# Patient Record
Sex: Female | Born: 1965 | Race: White | Hispanic: No | Marital: Married | Smoking: Current every day smoker
Health system: Southern US, Community
[De-identification: ages and names within clinical notes are randomized; demographics above are authoritative.]

## PROBLEM LIST (undated history)

## (undated) DIAGNOSIS — E782 Mixed hyperlipidemia: Secondary | ICD-10-CM

## (undated) DIAGNOSIS — F1911 Other psychoactive substance abuse, in remission: Secondary | ICD-10-CM

## (undated) DIAGNOSIS — F988 Other specified behavioral and emotional disorders with onset usually occurring in childhood and adolescence: Secondary | ICD-10-CM

## (undated) DIAGNOSIS — M26609 Unspecified temporomandibular joint disorder, unspecified side: Secondary | ICD-10-CM

## (undated) DIAGNOSIS — N979 Female infertility, unspecified: Secondary | ICD-10-CM

## (undated) HISTORY — DX: Mixed hyperlipidemia: E78.2

## (undated) HISTORY — DX: Other psychoactive substance abuse, in remission: F19.11

## (undated) HISTORY — DX: Female infertility, unspecified: N97.9

## (undated) HISTORY — DX: Unspecified temporomandibular joint disorder, unspecified side: M26.609

## (undated) HISTORY — DX: Other specified behavioral and emotional disorders with onset usually occurring in childhood and adolescence: F98.8

---

## 1997-07-22 ENCOUNTER — Other Ambulatory Visit: Admission: RE | Admit: 1997-07-22 | Discharge: 1997-07-22 | Payer: Self-pay | Admitting: Orthopedic Surgery

## 1998-04-28 ENCOUNTER — Encounter: Payer: Self-pay | Admitting: Obstetrics and Gynecology

## 1998-04-28 ENCOUNTER — Ambulatory Visit (HOSPITAL_COMMUNITY): Admission: RE | Admit: 1998-04-28 | Discharge: 1998-04-28 | Payer: Self-pay | Admitting: Obstetrics and Gynecology

## 1998-05-26 ENCOUNTER — Encounter: Payer: Self-pay | Admitting: Obstetrics and Gynecology

## 1998-05-26 ENCOUNTER — Ambulatory Visit (HOSPITAL_COMMUNITY): Admission: RE | Admit: 1998-05-26 | Discharge: 1998-05-26 | Payer: Self-pay | Admitting: Obstetrics and Gynecology

## 1999-07-15 ENCOUNTER — Ambulatory Visit (HOSPITAL_COMMUNITY): Admission: RE | Admit: 1999-07-15 | Discharge: 1999-07-15 | Payer: Self-pay | Admitting: Obstetrics and Gynecology

## 1999-07-15 ENCOUNTER — Encounter: Payer: Self-pay | Admitting: Obstetrics and Gynecology

## 2000-01-07 ENCOUNTER — Other Ambulatory Visit: Admission: RE | Admit: 2000-01-07 | Discharge: 2000-01-07 | Payer: Self-pay | Admitting: Obstetrics and Gynecology

## 2000-04-18 ENCOUNTER — Encounter: Payer: Self-pay | Admitting: Obstetrics and Gynecology

## 2000-04-18 ENCOUNTER — Ambulatory Visit (HOSPITAL_COMMUNITY): Admission: RE | Admit: 2000-04-18 | Discharge: 2000-04-18 | Payer: Self-pay | Admitting: Obstetrics and Gynecology

## 2000-04-24 ENCOUNTER — Encounter: Payer: Self-pay | Admitting: Specialist

## 2000-04-24 ENCOUNTER — Encounter: Admission: RE | Admit: 2000-04-24 | Discharge: 2000-04-24 | Payer: Self-pay | Admitting: Specialist

## 2001-01-29 ENCOUNTER — Encounter: Payer: Self-pay | Admitting: Specialist

## 2001-01-29 ENCOUNTER — Encounter: Admission: RE | Admit: 2001-01-29 | Discharge: 2001-01-29 | Payer: Self-pay | Admitting: Specialist

## 2001-07-24 ENCOUNTER — Other Ambulatory Visit: Admission: RE | Admit: 2001-07-24 | Discharge: 2001-07-24 | Payer: Self-pay | Admitting: Obstetrics and Gynecology

## 2001-10-09 ENCOUNTER — Encounter: Admission: RE | Admit: 2001-10-09 | Discharge: 2001-10-09 | Payer: Self-pay | Admitting: *Deleted

## 2001-10-09 ENCOUNTER — Encounter: Payer: Self-pay | Admitting: *Deleted

## 2002-06-07 ENCOUNTER — Other Ambulatory Visit: Admission: RE | Admit: 2002-06-07 | Discharge: 2002-06-07 | Payer: Self-pay | Admitting: Obstetrics and Gynecology

## 2002-09-25 ENCOUNTER — Encounter: Admission: RE | Admit: 2002-09-25 | Discharge: 2002-09-25 | Payer: Self-pay | Admitting: Obstetrics and Gynecology

## 2002-09-25 ENCOUNTER — Encounter: Payer: Self-pay | Admitting: Obstetrics and Gynecology

## 2002-11-04 ENCOUNTER — Encounter: Payer: Self-pay | Admitting: *Deleted

## 2002-11-04 ENCOUNTER — Inpatient Hospital Stay (HOSPITAL_COMMUNITY): Admission: AD | Admit: 2002-11-04 | Discharge: 2002-11-04 | Payer: Self-pay | Admitting: *Deleted

## 2002-12-12 ENCOUNTER — Inpatient Hospital Stay (HOSPITAL_COMMUNITY): Admission: AD | Admit: 2002-12-12 | Discharge: 2002-12-12 | Payer: Self-pay | Admitting: *Deleted

## 2002-12-19 ENCOUNTER — Inpatient Hospital Stay (HOSPITAL_COMMUNITY): Admission: AD | Admit: 2002-12-19 | Discharge: 2002-12-22 | Payer: Self-pay | Admitting: Obstetrics and Gynecology

## 2003-01-28 ENCOUNTER — Other Ambulatory Visit: Admission: RE | Admit: 2003-01-28 | Discharge: 2003-01-28 | Payer: Self-pay | Admitting: Obstetrics and Gynecology

## 2004-02-19 ENCOUNTER — Other Ambulatory Visit: Admission: RE | Admit: 2004-02-19 | Discharge: 2004-02-19 | Payer: Self-pay | Admitting: Obstetrics and Gynecology

## 2004-04-02 ENCOUNTER — Ambulatory Visit: Payer: Self-pay | Admitting: Internal Medicine

## 2004-04-02 ENCOUNTER — Ambulatory Visit (HOSPITAL_BASED_OUTPATIENT_CLINIC_OR_DEPARTMENT_OTHER): Admission: RE | Admit: 2004-04-02 | Discharge: 2004-04-02 | Payer: Self-pay | Admitting: Obstetrics and Gynecology

## 2005-06-30 ENCOUNTER — Encounter: Admission: RE | Admit: 2005-06-30 | Discharge: 2005-06-30 | Payer: Self-pay | Admitting: Obstetrics and Gynecology

## 2006-04-01 LAB — POCT INR: INR: 1 (ref <=1.1)

## 2006-04-01 LAB — PROTIME-INR

## 2006-06-17 ENCOUNTER — Ambulatory Visit (HOSPITAL_COMMUNITY): Admission: RE | Admit: 2006-06-17 | Discharge: 2006-06-17 | Payer: Self-pay | Admitting: Obstetrics and Gynecology

## 2006-06-17 ENCOUNTER — Encounter (INDEPENDENT_AMBULATORY_CARE_PROVIDER_SITE_OTHER): Payer: Self-pay | Admitting: Specialist

## 2007-02-25 ENCOUNTER — Emergency Department (HOSPITAL_COMMUNITY): Admission: EM | Admit: 2007-02-25 | Discharge: 2007-02-25 | Payer: Self-pay | Admitting: Emergency Medicine

## 2010-04-04 ENCOUNTER — Encounter: Payer: Self-pay | Admitting: *Deleted

## 2010-07-27 NOTE — Consult Note (Signed)
Connie Bond, Connie Bond              ACCOUNT NO.:  [id number]   MEDICAL RECORD NO.:  [id number]          PATIENT TYPE:  EMS   LOCATION:  MAJO                         FACILITY:  [facility]   PHYSICIAN:  Karol T. Lazarus Salines, M.D. DATE OF BIRTH:  1965-09-12   DATE OF CONSULTATION:  02/25/2007  DATE OF DISCHARGE:  02/25/2007                                 CONSULTATION   CHIEF COMPLAINT:  Left ear pain.   HISTORY:  A 45 year old white female had maybe very slight itching in  the left ear less than a week ago. She does use Q-tips regularly as part  of her personal toilet. Beginning two days ago, she began developing  honest to goodness pain in the ear which has progressed. She used some  ear drops that her daughter uses for infection with her indwelling  tubes. This did not seem to make any difference. She feels like the  external meatus is swollen and came in for evaluation. No fever. She is  diabetic or otherwise immune compromised. The right ear is giving her no  trouble. 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 Her husband has reported that she seems to  be doing this more recently. She does not particularly chew gum, crunch  ice cubes, popcorn kernels, etc.   PHYSICAL EXAMINATION:  This is a tall, thin, healthy-appearing, middle-  age, white female who appears somewhat uncomfortable. Mental status is  appropriate. She hears well in conversational speech. Voice is clear and  respirations unlabored through the nose. Head is atraumatic and neck  supple. Cranial nerves intact. The right ear canal is excessively clean  but otherwise normal with a healthy aerated drum. On the left side,  there is a slight bit of moisture consistent with the drops she has been  using. The external meatus is minimally tender with insertion of the  otoscope speculum. Deep to this the canal is once again devoid of wax  but clean with a normal aerated drum. No evidence of  vesicles. No  evidence of folliculitis or canal skin edema. She does have some  tenderness over the left TMJ, definite crepitus, and possibly even a  little bit of parotid swelling. She is minimally tender with traction on  the pinna itself. The nose is clear. Oral cavity is clear. The  oropharynx clear. Did not examine nasopharynx or hypopharynx. Neck  unremarkable.   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 Possible  early external otitis, left.   PLAN:  Since she has already started the eardrops, I will continue  these. I gave her a small quantity of Tylenol #3 to help her sleep. I  recommend Motrin 600 mg q.i.d. as an antiinflammatory. She needs to  avoid those activities which may be aggravating the TMJ and ask her  husband to clarify the extent of her nocturnal bruxism. I think she  needs to see the dentist relatively urgently especially the possibility  that she may have dislocated the articular disk. I am glad to see her  back in my office as needed. She understands and  agrees.      Gloris Manchester. Lazarus Salines, M.D.  Electronically Signed     KTW/MEDQ  D:  02/25/2007  T:  02/26/2007  Job:  [id number]

## 2010-07-30 NOTE — Procedures (Signed)
NAME:  Connie Bond, Connie Bond              ACCOUNT NO.:  [id number]   MEDICAL RECORD NO.:  [id number]          PATIENT TYPE:  OUT   LOCATION:  SLEEP CENTER                 FACILITY:  [facility]   PHYSICIAN:  Clinton D. Maple Hudson, M.D. DATE OF BIRTH:  [birth year]   DATE OF STUDY:  04/02/2004                              NOCTURNAL POLYSOMNOGRAM   REFERRING PHYSICIAN:  Candice Camp, MD   INDICATIONS FOR STUDY:  Hypersomnia with sleep apnea. Epworth sleepiness  score 6/24, BMI 20, weight 148 pounds.   SLEEP ARCHITECTURE:  Total sleep time 372 minutes with sleep efficiency of  90%. Stage I was 7%, stage II was 64%, stages III and IV were 8%, REM was  12% of total sleep time. Latency to sleep onset 24 minutes. Latency to REM  108 minutes. Awake after sleep onset 19 minutes. Arousal index 19. She took  Ambien before sleep.   RESPIRATORY DATA:  RDI of 5 obstructive events per hour which is at the  upper limits of normal or borderline abnormal. This included 2 obstructive  apneas and 29 hypopneas. Events were recorded while sleeping supine and on  her left side. REM RDI of 10 per hour. There were insufficient events to  permit use of split study CPAP titration protocol.   OXYGEN DATA:  Mild snoring with oxygen desaturation to a nadir of 89%. Mean  oxygen saturation through the study on room air was 97% to 98%.   CARDIAC DATA:  Normal sinus rhythm.   MOVEMENT/PARASOMNIA:  Occasional leg jerks averaging 1.3 arousals per hour  which is probably insignificant.   IMPRESSION/RECOMMENDATIONS:  Obstructive sleep disordered breathing events  were noted at a rate (RDI) of 5 per hour. This is at the upper limits of  normal or just borderline abnormal. May not justify a specific diagnosis of  obstructive sleep apnea/hypopnea syndrome. Events in this frequency range  may be best addressed clinically with efforts at weight loss, attention to  nasal decongestion, and encouragement to sleep off the flat of the back.  CPAP is not usually considered necessary unless there are special  circumstances.      CDY/MEDQ  D:  04/11/2004 09:40:55  T:  04/11/2004 09:55:50  Job:  [id number]

## 2010-07-30 NOTE — Op Note (Signed)
Connie Bond, Connie Bond              ACCOUNT NO.:  [id number]   MEDICAL RECORD NO.:  [id number]          PATIENT TYPE:  AMB   LOCATION:  SDC                           FACILITY:  [facility]   PHYSICIAN:  Dineen Kid. Rana Snare, M.D.    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   POSTOPERATIVE DIAGNOSIS:  Missed abortion at 10 weeks with embryonic  demise.   PROCEDURE:  Dilation, evacuation.   SURGEON:  Dr. Candice Camp.   ANESTHESIA:  Monitored anesthetic care and local by paracervical block.   INDICATIONS:  [name] is a 45 year old at [redacted] weeks gestational age  who presented to my office 2 days ago for new OB evaluation.  Ultrasound  findings at that time showed a 7-1/2-week embryonic demise.  The patient  presents today for dilation, evacuation.  Blood type is A+.  The risks  and benefits were discussed at length.  Informed consent was obtained.   DESCRIPTION OF PROCEDURE:  After adequate anesthesia, the patient placed  in the dorsal lithotomy position.  She is sterilely prepped and draped.  The bladder is sterilely drained.  Graves speculum is placed.  Tenaculum  was placed on the anterior lip of the cervix.  A paracervical block was  placed and 1% Xylocaine 1:100,000 epinephrine.  Uterus was sounded to 10  cm, easily dilated to a #27 Pratt dilator.  An 8 mm suction curette was  inserted.  Products of conception were received.  Curettage performed  until the endometrial cavity was empty as evidenced by no products of  conception being retrieved and also gritty surface felt throughout the  endometrial cavity.  The patient received Methergine 0.2 mg IM with good  uterine response.  The tenaculum was removed from the cervix, found to  hemostatic.  The patient was then transferred to the recovery room in  stable condition.  Sponge,  needle, and instrument counts were normal x3.  Estimated blood loss was minimal.  The patient received Toradol 30 mg IM  postoperatively.   DISPOSITION:  The patient will be discharged home with followup in the  office in 2-3 weeks.  Sent home with our routine instruction sheet for a  D&C, told to return for increased pain, fever, or bleeding.  She has a  Methergine prescription for 0.2 mg q.8h. x2 days and doxycycline 100 mg  p.o. b.i.d. x7 days.      Dineen Kid Rana Snare, M.D.  Electronically Signed     DCL/MEDQ  D:  06/17/2006  T:  06/17/2006  Job:  [id number]

## 2010-08-03 LAB — LIPID PANEL
LDL Cholesterol: 126 mg/dL
Triglycerides: 112 mg/dL (ref 40–160)

## 2010-08-03 LAB — TSH: TSH: 2.1 u[IU]/mL (ref <=5.90)

## 2011-01-11 ENCOUNTER — Encounter: Payer: Self-pay | Admitting: Emergency Medicine

## 2011-01-11 DIAGNOSIS — E782 Mixed hyperlipidemia: Secondary | ICD-10-CM | POA: Insufficient documentation

## 2011-01-11 DIAGNOSIS — N979 Female infertility, unspecified: Secondary | ICD-10-CM | POA: Insufficient documentation

## 2011-01-11 DIAGNOSIS — J309 Allergic rhinitis, unspecified: Secondary | ICD-10-CM | POA: Insufficient documentation

## 2011-01-11 DIAGNOSIS — M26609 Unspecified temporomandibular joint disorder, unspecified side: Secondary | ICD-10-CM | POA: Insufficient documentation

## 2011-01-11 DIAGNOSIS — G4700 Insomnia, unspecified: Secondary | ICD-10-CM

## 2011-01-11 DIAGNOSIS — F988 Other specified behavioral and emotional disorders with onset usually occurring in childhood and adolescence: Secondary | ICD-10-CM | POA: Insufficient documentation

## 2011-04-19 ENCOUNTER — Encounter: Payer: Self-pay | Admitting: *Deleted

## 2011-04-26 ENCOUNTER — Telehealth: Payer: Self-pay

## 2011-04-26 NOTE — Telephone Encounter (Signed)
.  umfc     PT  WONDERING IF SHE COULD TAKE GENERIC CYMBALTA,WOULD IT BE AS EFFECTIVE ,   BEST PHONE [phone]   PHARM[facility]Y [facility]

## 2011-04-28 NOTE — Telephone Encounter (Signed)
LMOM to CB at home and cell #s

## 2011-05-05 ENCOUNTER — Telehealth: Payer: Self-pay

## 2011-05-05 MED ORDER — DULOXETINE HCL 60 MG PO CPEP: 60.0000 mg | ORAL_CAPSULE | Freq: Every day | ORAL | Status: DC

## 2011-05-05 NOTE — Telephone Encounter (Signed)
Pt asked if generic Cymbalta would be as effective and if we can send in 90 day supply to her new mail order at [phone], and then a 30 day to [facility] bc she is out tomorrow. Told pt generics are normally just as effective but there is no generic for Cymbalta yet. She said the med they recommended was Venlafaxine but she has been doing well on Cymbalta (thinks Dr Cleta Alberts put her on it to increase mood and focus) and would only want to change if Dr Cleta Alberts thinks it would be very similar and work as well. Called in local RF for generic cymbalta 60 QD, and [facility] above number per pt request. Pt to check with [facility] to see what her cost is and requests CB if Dr Cleta Alberts thinks she should try the Venlafaxine or another generic instead. She will cancel mail order if she wants to change it. Dr Cleta Alberts, please advise on change.

## 2011-05-06 NOTE — Telephone Encounter (Signed)
Gave pt Dr Ellis Parents message and she agreed to continue with cymbalta

## 2011-05-06 NOTE — Telephone Encounter (Signed)
Please let Kylani know I would feel uncomfortable with her taking Effexor. The Cymbalta is a better choice for her.Marland Kitchen

## 2011-05-10 ENCOUNTER — Ambulatory Visit: Payer: Self-pay | Admitting: Emergency Medicine

## 2011-06-21 ENCOUNTER — Encounter: Payer: Self-pay | Admitting: Emergency Medicine

## 2011-06-21 ENCOUNTER — Ambulatory Visit (INDEPENDENT_AMBULATORY_CARE_PROVIDER_SITE_OTHER): Payer: 59 | Admitting: Emergency Medicine

## 2011-06-21 VITALS — BP 129/75 | HR 112 | Temp 98.8°F | Resp 16 | Ht 71.0 in | Wt 142.0 lb

## 2011-06-21 DIAGNOSIS — G4700 Insomnia, unspecified: Secondary | ICD-10-CM

## 2011-06-21 DIAGNOSIS — F411 Generalized anxiety disorder: Secondary | ICD-10-CM

## 2011-06-21 DIAGNOSIS — R1084 Generalized abdominal pain: Secondary | ICD-10-CM

## 2011-06-21 DIAGNOSIS — R110 Nausea: Secondary | ICD-10-CM

## 2011-06-21 DIAGNOSIS — R197 Diarrhea, unspecified: Secondary | ICD-10-CM

## 2011-06-21 LAB — POCT CBC
HCT, POC: 34.8 % — AB (ref 37.7–47.9)
Lymph, poc: 1.1 (ref 0.6–3.4)
MCH, POC: 30.5 pg (ref 27–31.2)
MCV: 93.1 fL (ref 80–97)
MID (cbc): 0.7 (ref 0–0.9)
POC LYMPH PERCENT: 13.6 %L (ref 10–50)
Platelet Count, POC: 225 10*3/uL (ref 142–424)
RDW, POC: 12.7 %
WBC: 8.3 10*3/uL (ref 4.6–10.2)

## 2011-06-21 MED ORDER — ZOLPIDEM TARTRATE 10 MG PO TABS: ORAL_TABLET | ORAL | Status: DC

## 2011-06-21 MED ORDER — CYCLOBENZAPRINE HCL 10 MG PO TABS: 10.0000 mg | ORAL_TABLET | Freq: Every day | ORAL | Status: DC

## 2011-06-21 MED ORDER — ZOLPIDEM TARTRATE 5 MG PO TABS: 5.0000 mg | ORAL_TABLET | Freq: Every evening | ORAL | Status: DC | PRN

## 2011-06-21 MED ORDER — ONDANSETRON 8 MG PO TBDP
8.0000 mg | ORAL_TABLET | Freq: Three times a day (TID) | ORAL | Status: AC | PRN
Start: 2011-06-21 — End: 2011-06-28

## 2011-06-21 NOTE — Progress Notes (Signed)
  Subjective:    Patient ID: Connie Bond, female    DOB: Aug 21, 1965, 46 y.o.   MRN: [id number]  HPI patient enters for a routine checkup however she was in [place] last week for spring break and on Sunday developed frequent loose stools associated with nausea. She has not had any fever or chills    Review of Systems she does have a history of depression on Cymbalta she also has an issue with back discomfort and take Flexeril for this. She also has been on Ambien for sleep     Objective:   Physical Exam  Constitutional: She appears well-developed and well-nourished.  HENT:  Head: Normocephalic.  Eyes: Pupils are equal, round, and reactive to light.  Neck: No JVD present. No tracheal deviation present. No thyromegaly present.  Cardiovascular: Normal rate and regular rhythm.  Exam reveals no gallop and no friction rub.   No murmur heard. Pulmonary/Chest: Breath sounds normal. No respiratory distress.  Abdominal: Soft. Bowel sounds are normal. She exhibits no distension and no mass. There is no tenderness. There is no rebound and no guarding.  Lymphadenopathy:    She has no cervical adenopathy.          Assessment & Plan:   Is stable on current medication regimen. She appears to have picked up a GI bug on her tip in [place]. It is also possible she picked up norovirus on her plane trip. Have refilled her Flexeril and Ambien. Cordelia Pen has refills on Cymbalta. I called in Zofran 8 mg to help with nausea and she will be on Imodium right ear as well as clear liquids. We'll check stool for O&P Cryptosporidium and Giardia stool culture. AVC check was within normal limits except for mild anemia.

## 2011-06-22 NOTE — Progress Notes (Signed)
Addended by: Johnnette Litter on: 06/22/2011 12:44 PM   Modules accepted: Orders

## 2011-06-23 LAB — CRYPTOSPORIDIUM SMEAR, FECAL
Result - CRYPSM: NONE SEEN
Result - CRYPSM: NONE SEEN
Result - CRYPSM: NONE SEEN

## 2011-06-26 LAB — STOOL CULTURE

## 2011-09-09 ENCOUNTER — Telehealth: Payer: Self-pay | Admitting: *Deleted

## 2011-09-09 NOTE — Telephone Encounter (Signed)
Spoke with patient to schedule a genetic appt and she was unavailable to make it at this time.  She will call me back later today or on Monday.

## 2011-09-12 ENCOUNTER — Telehealth: Payer: Self-pay

## 2011-09-12 DIAGNOSIS — G4700 Insomnia, unspecified: Secondary | ICD-10-CM

## 2011-09-12 NOTE — Telephone Encounter (Signed)
Called patient and she said she was going to call me back in a few minutes with info needed.

## 2011-09-12 NOTE — Telephone Encounter (Signed)
Pt is leaving to go out of town tomorrow and has been out of her cymbalta for 3 days she has been having trouble with the mail order. She would like a nurse to call her asap she has to have her cymbalta.

## 2011-09-13 MED ORDER — ZOLPIDEM TARTRATE 5 MG PO TABS: 5.0000 mg | ORAL_TABLET | Freq: Every evening | ORAL | Status: DC | PRN

## 2011-09-13 MED ORDER — CYCLOBENZAPRINE HCL 10 MG PO TABS: 10.0000 mg | ORAL_TABLET | Freq: Every day | ORAL | Status: DC

## 2011-09-13 MED ORDER — DULOXETINE HCL 60 MG PO CPEP: 60.0000 mg | ORAL_CAPSULE | Freq: Every day | ORAL | Status: DC

## 2011-09-13 NOTE — Telephone Encounter (Signed)
Pt requests that a 30 day supply of [facility] be [facility] bc she has already been out for 3 days. Pt also requests that we change the Rxs for Ambien and Flexeril at [facility] for 90 days at a time instead of 30 so that ins will cover, and if we can also send a 90 day Rx of Cymbalta to [facility] so that they will have that as well. [facility] and [facility]. Sending in corrected Rxs of others to [facility]. Faxed Rx in for Ambien that printed.

## 2011-09-22 ENCOUNTER — Telehealth: Payer: Self-pay | Admitting: *Deleted

## 2011-09-22 NOTE — Telephone Encounter (Signed)
Confirmed 11/10/11 genetic appt w/ pt.  Called Clydie Braun at referring to make aware.  Took paperwork to Clydie Braun.

## 2011-11-10 ENCOUNTER — Other Ambulatory Visit: Payer: Self-pay

## 2011-11-10 ENCOUNTER — Encounter: Payer: Self-pay | Admitting: Genetic Counselor

## 2011-12-26 ENCOUNTER — Encounter: Payer: Self-pay | Admitting: Genetic Counselor

## 2011-12-26 ENCOUNTER — Other Ambulatory Visit: Payer: Self-pay | Admitting: Lab

## 2012-02-21 ENCOUNTER — Telehealth: Payer: Self-pay

## 2012-02-21 DIAGNOSIS — G4700 Insomnia, unspecified: Secondary | ICD-10-CM

## 2012-02-21 MED ORDER — DULOXETINE HCL 60 MG PO CPEP: 60.0000 mg | ORAL_CAPSULE | Freq: Every day | ORAL | Status: DC

## 2012-02-21 MED ORDER — ZOLPIDEM TARTRATE 5 MG PO TABS: 5.0000 mg | ORAL_TABLET | Freq: Every evening | ORAL | Status: DC | PRN

## 2012-02-21 NOTE — Telephone Encounter (Signed)
Patient states she is completely out of Cymbalta because mail order has switched. She has that taken care of, but in the mean time would like to know if we could refill med for 4-5 days. Patient also has some questions about Ambien rx. [facility] [address] [phone]

## 2012-02-21 NOTE — Telephone Encounter (Signed)
rx called in for Ambien and Cymbalta.  She did not need a refill on the Flexeril.  She will call to set up an appt or come before running out.

## 2012-02-21 NOTE — Telephone Encounter (Signed)
Pt would like a refill on her Cymbalta sent to [facility] until her mailorder comes in.  Also she would like to know if she can go ahead and get a refill on Ambien and Flexeril.  She will call back with Mailorder pharmacy to send meds (Ambien and Flexeril) into.

## 2012-02-21 NOTE — Telephone Encounter (Signed)
She can have a refill on her Cymbalta sent to [facility] until her 90 day prescription comes in. She can also have one refill on her Ambien and Flexeril. She needs to call and get an appointment to see me at 104 before she has any more refills or she can see me at 102.

## 2012-06-19 ENCOUNTER — Telehealth: Payer: Self-pay | Admitting: Radiology

## 2012-06-19 ENCOUNTER — Telehealth: Payer: Self-pay

## 2012-06-19 MED ORDER — DULOXETINE HCL 60 MG PO CPEP: 60.0000 mg | ORAL_CAPSULE | Freq: Every day | ORAL | Status: DC

## 2012-06-19 NOTE — Telephone Encounter (Signed)
Medication sent in for her, called her to advise. Advised also for this medication to be effective, she must take daily, not prn med.

## 2012-06-19 NOTE — Telephone Encounter (Signed)
Patient advised in Dec she needs appt, for refills. She is not taking daily, since the Rx has lasted from Dec until now. Please advise.

## 2012-06-19 NOTE — Telephone Encounter (Signed)
That prescription needs to be taken on a regular basis. She can have one month refill but she needs to come in and see me at 104 or 102 to discuss whether she needs to be on that medication

## 2012-06-19 NOTE — Telephone Encounter (Signed)
Spoke to patient she states in Dec she did get a 3 month supply of this medication, unfortunately this is not documented in the computer. She does take this daily and she is thankful for the supply you authorized today. She plans to see you before this runs out . Connie Bond

## 2012-06-19 NOTE — Telephone Encounter (Addendum)
PT STATES SHE IS GOING OUT OF TOWN TONIGHT AND WOULD LIKE TO HAVE A WEEK'S SUPPLY OF HER CYMBALTA. STATES SHE CANNOT FIND  THE BOTTLE THAT SHE HAD SINCE GOING OUT OF TOWN A LOT.   PLEASE CALL [phone]    [facility] [address]

## 2012-07-05 ENCOUNTER — Telehealth: Payer: Self-pay

## 2012-07-05 NOTE — Telephone Encounter (Signed)
I called the patient and left a message on her answering machine. I told her to my knowledge they did not make a generic Cymbalta. I told her if she was having symptoms related to the medication I would be happy to see her. I told her to double check with the pharmacy and see if they had any other information.

## 2012-07-05 NOTE — Telephone Encounter (Signed)
Patient returned call. Dr Cleta Alberts spoke to her at the desk- pt is going to remain on Cymbalta- generic form.

## 2012-07-05 NOTE — Telephone Encounter (Signed)
Connie Bond is a pt of Dr. Deforest Hoyles, and she is taking Cymbalta in the generic form. She said that Dr. Cleta Alberts told her not to take the generic form. She states that she has been taking it for 2 weeks and she is feeling strange. Call back number is [phone]

## 2012-08-16 ENCOUNTER — Other Ambulatory Visit: Payer: Self-pay | Admitting: Emergency Medicine

## 2012-08-30 ENCOUNTER — Other Ambulatory Visit: Payer: Self-pay | Admitting: Physician Assistant

## 2012-08-31 NOTE — Telephone Encounter (Signed)
No further refills until ov

## 2012-09-04 ENCOUNTER — Encounter: Payer: Self-pay | Admitting: Emergency Medicine

## 2012-09-04 ENCOUNTER — Ambulatory Visit (INDEPENDENT_AMBULATORY_CARE_PROVIDER_SITE_OTHER): Payer: BC Managed Care – PPO | Admitting: Emergency Medicine

## 2012-09-04 VITALS — BP 127/87 | HR 80 | Temp 98.6°F | Resp 16 | Ht 70.0 in | Wt 153.4 lb

## 2012-09-04 DIAGNOSIS — M25552 Pain in left hip: Secondary | ICD-10-CM

## 2012-09-04 DIAGNOSIS — R51 Headache: Secondary | ICD-10-CM

## 2012-09-04 DIAGNOSIS — F329 Major depressive disorder, single episode, unspecified: Secondary | ICD-10-CM

## 2012-09-04 DIAGNOSIS — M25559 Pain in unspecified hip: Secondary | ICD-10-CM

## 2012-09-04 DIAGNOSIS — G4700 Insomnia, unspecified: Secondary | ICD-10-CM

## 2012-09-04 LAB — CBC WITH DIFFERENTIAL/PLATELET
Basophils Absolute: 0.1 10*3/uL (ref 0.0–0.1)
Eosinophils Relative: 9 % — ABNORMAL HIGH (ref 0–5)
HCT: 36.7 % (ref 36.0–46.0)
Hemoglobin: 12.8 g/dL (ref 12.0–15.0)
Lymphocytes Relative: 28 % (ref 12–46)
Lymphs Abs: 1.9 10*3/uL (ref 0.7–4.0)
MCV: 88.9 fL (ref 78.0–100.0)
Monocytes Absolute: 0.6 10*3/uL (ref 0.1–1.0)
Monocytes Relative: 9 % (ref 3–12)
Neutro Abs: 3.4 10*3/uL (ref 1.7–7.7)
RDW: 12.6 % (ref 11.5–15.5)
WBC: 6.6 10*3/uL (ref 4.0–10.5)

## 2012-09-04 MED ORDER — ZOLPIDEM TARTRATE 5 MG PO TABS: 5.0000 mg | ORAL_TABLET | Freq: Every evening | ORAL | Status: DC | PRN

## 2012-09-04 MED ORDER — DULOXETINE HCL 60 MG PO CPEP: 60.0000 mg | ORAL_CAPSULE | Freq: Every day | ORAL | Status: DC

## 2012-09-04 NOTE — Progress Notes (Signed)
  Subjective:    Patient ID: Connie Bond, female    DOB: 07-Aug-1965, 47 y.o.   MRN: [id number]  HPI patient in for recheck of her ADD depression and symptoms of menopause. On her last visit here I checked an FSH and LH and it did not show any signs of menopause . She has menopausal symptoms. She also has been drinking too much. She's been drinking 3-4 drinks every night. She continues on her Cymbalta. She says her relationship with her husband has improved. She's not using any illicit drugs at the present time.    Review of Systems     Objective:   Physical Exam patient is alert cooperative but does have a ruddy complexion. Her neck is supple. Her chest is clear to both auscultation and percussion her heart is regular rate no murmurs        Assessment & Plan:  I repeated her thyroid FSH and LH. She has an appointment to see her gynecologist in the next couple months but advised her call earlier and see if they could see her sooner. I do think that alcohol is a major issue and she needs to cut back to one to 2 drinks at night at a maximum. I also told her I would be happy to refer her to a counselor to discuss these issues.

## 2012-09-05 LAB — FOLLICLE STIMULATING HORMONE: FSH: 58.1 m[IU]/mL

## 2012-09-05 LAB — COMPREHENSIVE METABOLIC PANEL
ALT: 22 U/L (ref 0–35)
BUN: 11 mg/dL (ref 6–23)
CO2: 29 mEq/L (ref 19–32)
Creat: 1.1 mg/dL (ref 0.50–1.10)
Total Bilirubin: 0.4 mg/dL (ref 0.3–1.2)

## 2012-09-05 LAB — TSH: TSH: 2.501 u[IU]/mL (ref 0.350–4.500)

## 2012-10-16 ENCOUNTER — Ambulatory Visit: Payer: 59 | Admitting: Emergency Medicine

## 2013-03-08 ENCOUNTER — Other Ambulatory Visit: Payer: Self-pay | Admitting: Emergency Medicine

## 2013-03-12 ENCOUNTER — Other Ambulatory Visit: Payer: Self-pay

## 2013-08-14 ENCOUNTER — Other Ambulatory Visit: Payer: Self-pay | Admitting: Obstetrics and Gynecology

## 2013-08-14 DIAGNOSIS — R928 Other abnormal and inconclusive findings on diagnostic imaging of breast: Secondary | ICD-10-CM

## 2013-08-19 ENCOUNTER — Ambulatory Visit
Admission: RE | Admit: 2013-08-19 | Discharge: 2013-08-19 | Disposition: A | Discharge status: Home / Self Care | Payer: BC Managed Care – PPO | Source: Ambulatory Visit | Attending: Obstetrics and Gynecology | Admitting: Obstetrics and Gynecology

## 2013-08-19 ENCOUNTER — Other Ambulatory Visit: Payer: Self-pay | Admitting: Obstetrics and Gynecology

## 2013-08-19 DIAGNOSIS — R921 Mammographic calcification found on diagnostic imaging of breast: Secondary | ICD-10-CM

## 2013-08-19 DIAGNOSIS — R928 Other abnormal and inconclusive findings on diagnostic imaging of breast: Secondary | ICD-10-CM

## 2013-08-26 ENCOUNTER — Other Ambulatory Visit: Payer: Self-pay | Admitting: Emergency Medicine

## 2013-08-26 ENCOUNTER — Ambulatory Visit
Admission: RE | Admit: 2013-08-26 | Discharge: 2013-08-26 | Disposition: A | Discharge status: Home / Self Care | Payer: BC Managed Care – PPO | Source: Ambulatory Visit | Attending: Obstetrics and Gynecology | Admitting: Obstetrics and Gynecology

## 2013-08-26 DIAGNOSIS — R921 Mammographic calcification found on diagnostic imaging of breast: Secondary | ICD-10-CM

## 2013-09-22 ENCOUNTER — Other Ambulatory Visit: Payer: Self-pay | Admitting: Emergency Medicine

## 2013-09-23 NOTE — Telephone Encounter (Signed)
Pt has been out of Cymbalta for 2 days.   She has setup an appt with Dr. Everlene Farrier on 10/08/2013 @ 4:15 pm.  She was wanting to see if [facility].  Pt [phone]

## 2013-10-08 ENCOUNTER — Ambulatory Visit: Payer: BC Managed Care – PPO | Admitting: Emergency Medicine

## 2013-10-14 ENCOUNTER — Ambulatory Visit: Payer: BC Managed Care – PPO | Admitting: Emergency Medicine

## 2013-10-20 ENCOUNTER — Telehealth: Payer: Self-pay

## 2013-10-20 NOTE — Telephone Encounter (Signed)
Pt will need refill on cymbalta before her upcoming appt with dr Everlene Farrier on 11/05/13. Pt had an appt scheduled but it has been rescheduled by the provider three times now.   Pt states she will run out before her appt.   Best: [phone]  bf

## 2013-10-22 MED ORDER — DULOXETINE HCL 60 MG PO CPEP: ORAL_CAPSULE | ORAL | Status: DC

## 2013-10-22 NOTE — Telephone Encounter (Signed)
30 day supply sent to the pharmacy. Pt advised.  

## 2013-11-05 ENCOUNTER — Ambulatory Visit: Payer: BC Managed Care – PPO | Admitting: Emergency Medicine

## 2013-11-12 ENCOUNTER — Ambulatory Visit: Payer: BC Managed Care – PPO | Admitting: Emergency Medicine

## 2013-11-19 ENCOUNTER — Ambulatory Visit (INDEPENDENT_AMBULATORY_CARE_PROVIDER_SITE_OTHER): Payer: BC Managed Care – PPO | Admitting: Emergency Medicine

## 2013-11-19 ENCOUNTER — Encounter: Payer: Self-pay | Admitting: Emergency Medicine

## 2013-11-19 VITALS — BP 120/84 | HR 95 | Temp 97.8°F | Resp 16 | Ht 70.0 in | Wt 147.0 lb

## 2013-11-19 DIAGNOSIS — M2550 Pain in unspecified joint: Secondary | ICD-10-CM

## 2013-11-19 DIAGNOSIS — N951 Menopausal and female climacteric states: Secondary | ICD-10-CM

## 2013-11-19 DIAGNOSIS — T148XXA Other injury of unspecified body region, initial encounter: Secondary | ICD-10-CM

## 2013-11-19 DIAGNOSIS — G4700 Insomnia, unspecified: Secondary | ICD-10-CM

## 2013-11-19 DIAGNOSIS — Z780 Asymptomatic menopausal state: Secondary | ICD-10-CM

## 2013-11-19 DIAGNOSIS — R21 Rash and other nonspecific skin eruption: Secondary | ICD-10-CM

## 2013-11-19 LAB — POCT CBC
Granulocyte percent: 72.3 %G (ref 37–80)
HCT, POC: 37.8 % (ref 37.7–47.9)
HEMOGLOBIN: 12.6 g/dL (ref 12.2–16.2)
Lymph, poc: 1.9 (ref 0.6–3.4)
MCH: 31.1 pg (ref 27–31.2)
MCHC: 33.3 g/dL (ref 31.8–35.4)
MCV: 93.2 fL (ref 80–97)
MID (cbc): 0.4 (ref 0–0.9)
MPV: 6.2 fL (ref 0–99.8)
PLATELET COUNT, POC: 306 10*3/uL (ref 142–424)
POC GRANULOCYTE: 5.9 (ref 2–6.9)
POC LYMPH %: 23.1 % (ref 10–50)
POC MID %: 4.6 % (ref 0–12)
RBC: 4.05 M/uL (ref 4.04–5.48)
RDW, POC: 12.9 %
WBC: 8.2 10*3/uL (ref 4.6–10.2)

## 2013-11-19 MED ORDER — DULOXETINE HCL 30 MG PO CPEP: ORAL_CAPSULE | ORAL | Status: DC

## 2013-11-19 MED ORDER — DULOXETINE HCL 60 MG PO CPEP: ORAL_CAPSULE | ORAL | Status: DC

## 2013-11-19 NOTE — Progress Notes (Addendum)
Subjective:  This chart was scribed for Connie Queen, MD by Donato Schultz, Medical Scribe. This patient was seen in Room 21 and the patient's care was started at 2:14 PM.    Patient ID: Connie Bond, female    DOB: 1965-09-28, 48 y.o.   MRN: [id number]  HPI HPI Comments: Connie Bond is a 48 y.o. female who presents to the [facility] complaining of a sudden onset of an itchy, generalized rash that started 2-3 weeks ago.  She developed generalized myalgias a week ago.  She denies sore throat or SOB as associated symptoms.  She states that she has been bruising a lot easier.  She has been taking [employer] Ultra Mega Green [employer] and Metabolism Formula, PGX Daily by Natural Factors, New Chapter Herbal Detoxification, and Censor Body Toner to help her lose weight and believes she may be having an allergic reaction to the medication.  She did not take any of those medications today.  She took Benadryl yesterday with no relief to her symptoms.  She does not have a history of allergies.    She is no longer taking pain medication.  She takes 60mg  of Cymbalta daily.  She takes Flexeril as needed.  She takes Ambien once or twice a week.  She takes an OTC medication, Remifemin, for her menopause symptoms.  She had disc surgery performed by Dr. Sherley Bounds a month ago.  Her OB/GYN is Dr. Louretta Shorten.  She had a needle biopsy of her right breast which came back negative.    She drinks 2 glasses of wine with dinner.  She does not use drugs.     Past Medical History  Diagnosis Date  . 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 Attention deficit disorder (ADD)   . TMJ (temporomandibular joint syndrome)   . Mixed hyperlipidemia   . Allergic rhinitis   . Hx of substance abuse     cocaine   No past surgical history on file. Family History  Problem Relation Age of Onset  . Heart disease Father   . Cancer Sister    History   Social History  . Marital Status: Married    Spouse  Name: N/A    Number of Children: N/A  . Years of Education: N/A   Occupational History  . Not on file.   Social History Main Topics  . Smoking status: Current Some Day Smoker  . Smokeless tobacco: Not on file  . Alcohol Use: Yes  . Drug Use: No  . Sexual Activity: Not on file   Other Topics Concern  . Not on file   Social History Narrative  . No narrative on file   No Known Allergies  Review of Systems  HENT: Negative for sore throat and trouble swallowing.   Respiratory: Negative for cough, chest tightness and shortness of breath.   Musculoskeletal: Positive for myalgias.  Skin: Positive for rash.     Objective:  Physical Exam  Nursing note and vitals reviewed. Constitutional: She is oriented to person, place, and time. She appears well-developed and well-nourished.  HENT:  Head: Normocephalic and atraumatic.  Right Ear: External ear normal.  Left Ear: External ear normal.  Nose: Nose normal.  Mouth/Throat: Oropharynx is clear and moist. No oropharyngeal exudate.  Eyes: Conjunctivae and EOM are normal. Pupils are equal, round, and reactive to light.  Neck: Normal range of motion. Neck supple. No thyromegaly present.  Cardiovascular: Normal rate, regular rhythm  and normal heart sounds.  Exam reveals no gallop and no friction rub.   No murmur heard. Pulmonary/Chest: Effort normal and breath sounds normal. No respiratory distress. She has no wheezes. She has no rales.  Abdominal: Soft. Bowel sounds are normal. There is no tenderness.  Musculoskeletal: Normal range of motion. She exhibits no edema and no tenderness.  Lymphadenopathy:    She has no cervical adenopathy.  Neurological: She is alert and oriented to person, place, and time.  Skin: Skin is warm and dry. Rash noted.  Urticaria with evidence of excoriation over both flank areas.  Bruising around the left breast where she has been scratching. There are areas of urticaria behind the right knee and as stated also  about over the flank areas and patient has these areas excoriated with some bruising where she has been scratching    Psychiatric: She has a normal mood and affect. Her behavior is normal.  Somewhat pressured speech but appropriate, oriented, and cooperative.    Results for orders placed in visit on 11/19/13  POCT CBC      Result Value Ref Range   WBC 8.2  4.6 - 10.2 K/uL   Lymph, poc 1.9  0.6 - 3.4   POC LYMPH PERCENT 23.1  10 - 50 %L   MID (cbc) 0.4  0 - 0.9   POC MID % 4.6  0 - 12 %M   POC Granulocyte 5.9  2 - 6.9   Granulocyte percent 72.3  37 - 80 %G   RBC 4.05  4.04 - 5.48 M/uL   Hemoglobin 12.6  12.2 - 16.2 g/dL   HCT, POC 37.8  37.7 - 47.9 %   MCV 93.2  80 - 97 fL   MCH, POC 31.1  27 - 31.2 pg   MCHC 33.3  31.8 - 35.4 g/dL   RDW, POC 12.9     Platelet Count, POC 306  142 - 424 K/uL   MPV 6.2  0 - 99.8 fL     BP 120/84  Pulse 95  Temp(Src) 97.8 F (36.6 C) (Oral)  Resp 16  Ht 5\' 10"  (1.778 m)  Wt 147 lb (66.679 kg)  BMI 21.09 kg/m2  SpO2 100% Assessment & Plan:  I personally performed the services described in this documentation, which was scribed in my presence. The recorded information has been reviewed and is accurate. She will have prescriptions for both Cymbalta 60 and Cymbalta 30. She will try and wean down off of the Cymbalta. She initially will alternate 60 of Cymbalta with 30 of Cymbalta and try and taper down over the next 3-4 months. Hopefully she will get the Cymbalta 30 a day . For her hives she will take Zyrtec one a day supplement with Benadryl and also be on ranitidine 150 twice a day. She will stop all her supplements. I feel one of the supplements or the black cohosh are the culprit. Referral made to an allergist for definitive testing .

## 2013-11-19 NOTE — Patient Instructions (Signed)
Take Zyrtec 10 mg one a day. Take Benadryl 25 mg one every 4-6 hours as needed. Take Zantac 150 twice a day. Please stop all of your supplements except for the probiotics.Hives Hives are itchy, red, swollen areas of the skin. They can vary in size and location on your body. Hives can come and go for hours or several days (acute hives) or for several weeks (chronic hives). Hives do not spread from person to person (noncontagious). They may get worse with scratching, exercise, and emotional stress. CAUSES   Allergic reaction to food, additives, or drugs.  Infections, including the common cold.  Illness, such as vasculitis, lupus, or thyroid disease.  Exposure to sunlight, heat, or cold.  Exercise.  Stress.  Contact with chemicals. SYMPTOMS   Red or white swollen patches on the skin. The patches may change size, shape, and location quickly and repeatedly.  Itching.  Swelling of the hands, feet, and face. This may occur if hives develop deeper in the skin. DIAGNOSIS  Your caregiver can usually tell what is wrong by performing a physical exam. Skin or blood tests may also be done to determine the cause of your hives. In some cases, the cause cannot be determined. TREATMENT  Mild cases usually get better with medicines such as antihistamines. Severe cases may require an emergency epinephrine injection. If the cause of your hives is known, treatment includes avoiding that trigger.  HOME CARE INSTRUCTIONS   Avoid causes that trigger your hives.  Take antihistamines as directed by your caregiver to reduce the severity of your hives. Non-sedating or low-sedating antihistamines are usually recommended. Do not drive while taking an antihistamine.  Take any other medicines prescribed for itching as directed by your caregiver.  Wear loose-fitting clothing.  Keep all follow-up appointments as directed by your caregiver. SEEK MEDICAL CARE IF:   You have persistent or severe itching that is  not relieved with medicine.  You have painful or swollen joints. SEEK IMMEDIATE MEDICAL CARE IF:   You have a fever.  Your tongue or lips are swollen.  You have trouble breathing or swallowing.  You feel tightness in the throat or chest.  You have abdominal pain. These problems may be the first sign of a life-threatening allergic reaction. Call your local emergency services (911 in [place] MAKE SURE YOU:   Understand these instructions.  Will watch your condition.  Will get help right away if you are not doing well or get worse. Document Released: 02/28/2005 Document Revised: 03/05/2013 Document Reviewed: 05/24/2011 [facility] Patient Information 2015 [place], [place]. This information is not intended to replace advice given to you by your health care provider. Make sure you discuss any questions you have with your health care provider.

## 2013-11-20 ENCOUNTER — Telehealth: Payer: Self-pay | Admitting: Emergency Medicine

## 2013-11-20 LAB — COMPLETE METABOLIC PANEL WITH GFR
ALT: 23 U/L (ref 0–35)
AST: 28 U/L (ref 0–37)
Albumin: 4.6 g/dL (ref 3.5–5.2)
Alkaline Phosphatase: 57 U/L (ref 39–117)
BUN: 15 mg/dL (ref 6–23)
CO2: 29 meq/L (ref 19–32)
Calcium: 9.6 mg/dL (ref 8.4–10.5)
Chloride: 96 mEq/L (ref 96–112)
Creat: 0.83 mg/dL (ref 0.50–1.10)
GFR, Est Non African American: 84 mL/min
GLUCOSE: 94 mg/dL (ref 70–99)
Potassium: 3.8 mEq/L (ref 3.5–5.3)
SODIUM: 133 meq/L — AB (ref 135–145)
TOTAL PROTEIN: 7.3 g/dL (ref 6.0–8.3)
Total Bilirubin: 0.5 mg/dL (ref 0.2–1.2)

## 2013-11-20 LAB — PROTIME-INR
INR: 0.89 (ref <1.50)
PROTHROMBIN TIME: 12.1 s (ref 11.6–15.2)

## 2013-11-20 LAB — RHEUMATOID FACTOR: Rhuematoid fact SerPl-aCnc: 14 IU/mL (ref <=14)

## 2013-11-20 LAB — TSH: TSH: 2.641 u[IU]/mL (ref 0.350–4.500)

## 2013-11-20 LAB — ANA: ANA: NEGATIVE

## 2013-11-20 LAB — SEDIMENTATION RATE: Sed Rate: 6 mm/hr (ref 0–22)

## 2013-11-20 NOTE — Telephone Encounter (Signed)
Call patient blood work regarding liver function and kidney function are  normal. This all appears related to an acute allergic reaction. Please continue medications and avoid all of your herbal supplement

## 2013-11-20 NOTE — Telephone Encounter (Signed)
Pt advised and understands. 

## 2013-11-21 ENCOUNTER — Telehealth: Payer: Self-pay | Admitting: *Deleted

## 2013-11-21 ENCOUNTER — Ambulatory Visit: Payer: BC Managed Care – PPO | Admitting: Emergency Medicine

## 2013-11-21 LAB — PTT FACTOR INHIBITOR (MIXING STUDY)

## 2013-11-21 NOTE — Telephone Encounter (Signed)
[facility] called and stated that the PTT was sent not frozen and could not do test.  Would you like for [place] to call and have pt return for blood draw?

## 2013-11-22 NOTE — Telephone Encounter (Signed)
Her liver tests are all normal we don't need to do the test

## 2014-02-18 ENCOUNTER — Ambulatory Visit: Payer: BC Managed Care – PPO | Admitting: Emergency Medicine

## 2014-03-20 DIAGNOSIS — M2550 Pain in unspecified joint: Secondary | ICD-10-CM | POA: Insufficient documentation

## 2014-03-24 ENCOUNTER — Ambulatory Visit: Payer: Self-pay | Admitting: Family Medicine

## 2014-04-01 ENCOUNTER — Ambulatory Visit: Payer: Self-pay | Admitting: Family Medicine

## 2014-04-02 ENCOUNTER — Encounter: Payer: Self-pay | Admitting: Family Medicine

## 2014-04-02 ENCOUNTER — Ambulatory Visit (INDEPENDENT_AMBULATORY_CARE_PROVIDER_SITE_OTHER): Payer: BLUE CROSS/BLUE SHIELD | Admitting: Family Medicine

## 2014-04-02 ENCOUNTER — Ambulatory Visit: Payer: Self-pay | Admitting: Family Medicine

## 2014-04-02 VITALS — BP 108/78 | HR 83 | Ht 70.0 in | Wt 150.0 lb

## 2014-04-02 DIAGNOSIS — M9903 Segmental and somatic dysfunction of lumbar region: Secondary | ICD-10-CM

## 2014-04-02 DIAGNOSIS — M357 Hypermobility syndrome: Secondary | ICD-10-CM | POA: Insufficient documentation

## 2014-04-02 DIAGNOSIS — M9904 Segmental and somatic dysfunction of sacral region: Secondary | ICD-10-CM

## 2014-04-02 DIAGNOSIS — M9902 Segmental and somatic dysfunction of thoracic region: Secondary | ICD-10-CM

## 2014-04-02 DIAGNOSIS — M2550 Pain in unspecified joint: Secondary | ICD-10-CM

## 2014-04-02 DIAGNOSIS — M999 Biomechanical lesion, unspecified: Secondary | ICD-10-CM | POA: Insufficient documentation

## 2014-04-02 MED ORDER — HYDROXYZINE HCL 25 MG PO TABS: 25.0000 mg | ORAL_TABLET | Freq: Three times a day (TID) | ORAL | Status: DC | PRN

## 2014-04-02 NOTE — Progress Notes (Signed)
[facility] [address] [place], [place] [address] Phone: [phone] Subjective:    I'm seeing this patient by the request  of:  DAUB, STEVE A, MD   CC: Arthralgia's.  [id number] [facility] is a 49 y.o. female coming in with complaint of  Multiple bone aches and pain. Patient states that she has had significant pains and injuries throughout her life. 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 Patient states that also she has a dull throbbing aching pain in multiple areas. Patient is on Cymbalta without any significant improvement. Patient does take anti-inflammatories fairly regularly. Patient used to be very active but finds that when she is active it causes more pain. Patient denies that there is any specific joint that seems to give her more trouble. Patient has been on multiple different medications without any significant benefit and would like to avoid prescription medications if possible. Patient denies any weakness of any of the joints. pain overall. Patient states feeling that seems to help his her chiropractor as well as massage therapies.    Past medical history, social, surgical and family history all reviewed in electronic medical record.   Review of Systems: No headache, visual changes, nausea, vomiting, diarrhea, constipation, dizziness, abdominal pain, skin rash, fevers, chills, night sweats, weight loss, swollen lymph nodes, body aches, joint swelling, muscle aches, chest pain, shortness of breath, mood changes.   Objective Blood pressure 108/78, pulse 83, height 5\' 10"  (1.778 m), weight 150 lb (68.04 kg), SpO2 98 %.  General: No apparent distress alert and oriented x3 mood and affect normal, dressed  appropriately. Very anxious individual HEENT: Pupils equal, extraocular movements intact  Respiratory: Patient's speak in full sentences and does not appear short of breath  Cardiovascular: No lower extremity edema, non tender, no erythema  Skin: Warm dry intact with no signs of infection or rash on extremities or on axial skeleton.  Abdomen: Soft nontender  Neuro: Cranial nerves II through XII are intact, neurovascularly intact in all extremities with 2+ DTRs and 2+ pulses.  Lymph: No lymphadenopathy of posterior or anterior cervical chain or axillae bilaterally.  Gait normal with good balance and coordination.  MSK:  Non tender with full range of motion and good stability and symmetric strength and tone of shoulders, elbows, wrist, hip, knee and ankles bilaterally.  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 Beighton score of 9 Patient does have multiple areas of diffuse tenderness to palpation. Nothing specific. Back Exam:  Inspection: Unremarkable  Motion: Flexion 45 deg, Extension 45 deg, Side Bending to 45 deg bilaterally,  Rotation to 45 deg bilaterally  SLR laying: Negative  XSLR laying: Negative  Palpable tenderness: Diffuse tenderness of the thoracic and lumbar spine FABER: negative. Sensory change: Gross sensation intact to all lumbar and sacral dermatomes.  Reflexes: 2+ at both patellar tendons, 2+ at achilles tendons, Babinski's downgoing.  Strength at foot  Plantar-flexion: 5/5 Dorsi-flexion: 5/5 Eversion: 5/5 Inversion: 5/5  Leg strength  Quad: 5/5 Hamstring: 5/5 Hip flexor: 5/5 Hip abductors: 5/5  Gait unremarkable.  Osteopathic findings Cervical- patient has history of anterior fusion Thoracic T3 extended rotated and side bent left with inhaled third rib T5 extended rotated and side bent right  Lumbar L2  flexed rotated and side bent right  Sacrum left on left  Procedure note 97110; 15 minutes spent for Therapeutic exercises as stated in above  notes.  This included exercises focusing on stretching, strengthening, with significant focus on eccentric aspects.   Proper technique shown and discussed handout in great detail with ATC.  All questions were discussed and answered.     Impression and Recommendations:     This case required medical decision making of moderate complexity.

## 2014-04-02 NOTE — Assessment & Plan Note (Signed)
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 Connective tissue disorder as well as marfanoid or Erhlos danlos syndrome will need to be within the differential. Patient has been in different healthcare systems and we'll try to consolidate some of her records here in the near future. We discussed the importance of strength training and avoiding any's type of flexible training. Patient will try to make these changes as well as the over-the-counter medications and come back again in 2-3 weeks. Patient does not make any great strides we may want to consider further workup.

## 2014-04-02 NOTE — Assessment & Plan Note (Signed)
Patient does have arthralgia multiple joints. Differential is quite broad. Patient does have a hypermobility syndrome and I do have to keep in the differential connective tissue disorder as well as a marfanoid-type symptoms. I do believe that this is why patient has gotten early arthritis in her joints even though she is 49 years of age. We discussed the importance of changing her diet, over-the-counter natural supplementations, as well as Tylenol scheduled. Patient is going to try to make these changes. Patient given some exercises for her low back as well as neck and did work with the Product/process development scientist today. Patient is, try to make these changes and come back and see me again in 2-3 weeks.

## 2014-04-02 NOTE — Patient Instructions (Addendum)
Good to see you In the gym focus on strength and not flexibility.  Try the pennsaid twice daily.  Take tylenol 650 mg three times a day is the best evidence based medicine we have for arthritis.  Glucosamine sulfate 750mg  twice a day is a supplement that has been shown to help moderate to severe arthritis. Vitamin D 2000 IU daily Fish oil 2 grams daily.  Tumeric 500mg  twice daily.  Hydroxyzine up to 3 times a day as needed.  See me again in 2 weeks.

## 2014-04-02 NOTE — Assessment & Plan Note (Signed)
Decision today to treat with OMT was based on Physical Exam  After verbal consent patient was treated with HVLA, ME techniques in thoracic, lumbar and sacr areas  Patient tolerated the procedure well with improvement in symptoms  Patient given exercises, stretches and lifestyle modifications  See medications in patient instructions if given  Patient will follow up in 2-3 weeks

## 2014-04-17 ENCOUNTER — Ambulatory Visit: Payer: BLUE CROSS/BLUE SHIELD | Admitting: Family Medicine

## 2014-04-22 ENCOUNTER — Ambulatory Visit: Payer: BLUE CROSS/BLUE SHIELD | Admitting: Family Medicine

## 2014-05-09 ENCOUNTER — Telehealth: Payer: Self-pay | Admitting: Emergency Medicine

## 2014-05-09 NOTE — Telephone Encounter (Signed)
Patient's allergic reaction has flared up. She wants a script for Hydroxyzine Pamoate. [facility]   [phone]

## 2014-05-09 NOTE — Telephone Encounter (Signed)
Spoke with Dr. Everlene Farrier, he states the medication she got from Dr. Tamala Julian is the same medication. Pt understood. No Rx sent in.

## 2014-05-09 NOTE — Telephone Encounter (Signed)
No this is a 10 mg tablet not 100 mg tablet.

## 2014-05-09 NOTE — Telephone Encounter (Signed)
Okay to call  in hydroxyzine which is Vistaril or Atarax 10 mg 3 times a day as needed for allergy symptoms #30 refills 2

## 2014-05-09 NOTE — Telephone Encounter (Signed)
Spoke with pt, she states this medication really works for her allergies and would like a RX sent in.

## 2014-05-09 NOTE — Telephone Encounter (Signed)
Dr. Everlene Farrier did you mean 100 mg, Rx pended. I wanted to make sure this is the right Rx.

## 2014-06-30 ENCOUNTER — Other Ambulatory Visit: Payer: Self-pay | Admitting: Emergency Medicine

## 2014-06-30 ENCOUNTER — Other Ambulatory Visit: Payer: Self-pay | Admitting: Obstetrics and Gynecology

## 2014-07-01 LAB — CYTOLOGY - PAP

## 2014-09-16 ENCOUNTER — Telehealth: Payer: Self-pay

## 2014-09-16 NOTE — Telephone Encounter (Signed)
Pt states she is having some issues she want to discuss with Dr Perfecto Kingdom asst. Please call [phone]

## 2014-09-16 NOTE — Telephone Encounter (Signed)
Patient states that she is experiencing random bruising and intense itching in her legs. She is highly concerned and she believes that it may be something to do with her liver. She needs to come in to be properly evaluated if she is that concerned about it.  Please advise  [phone]

## 2014-09-17 ENCOUNTER — Encounter: Payer: Self-pay | Admitting: Physician Assistant

## 2014-09-17 ENCOUNTER — Ambulatory Visit (INDEPENDENT_AMBULATORY_CARE_PROVIDER_SITE_OTHER): Payer: BLUE CROSS/BLUE SHIELD | Admitting: Physician Assistant

## 2014-09-17 VITALS — BP 118/68 | HR 84 | Temp 97.9°F | Resp 17 | Ht 69.5 in | Wt 145.0 lb

## 2014-09-17 DIAGNOSIS — R21 Rash and other nonspecific skin eruption: Secondary | ICD-10-CM | POA: Diagnosis not present

## 2014-09-17 DIAGNOSIS — D473 Essential (hemorrhagic) thrombocythemia: Secondary | ICD-10-CM | POA: Diagnosis not present

## 2014-09-17 DIAGNOSIS — D75839 Thrombocytosis, unspecified: Secondary | ICD-10-CM

## 2014-09-17 LAB — POCT URINALYSIS DIPSTICK
BILIRUBIN UA: NEGATIVE
GLUCOSE UA: NEGATIVE
KETONES UA: NEGATIVE
Leukocytes, UA: NEGATIVE
Nitrite, UA: NEGATIVE
Protein, UA: NEGATIVE
RBC UA: NEGATIVE
SPEC GRAV UA: 1.015
Urobilinogen, UA: 0.2
pH, UA: 7

## 2014-09-17 LAB — COMPLETE METABOLIC PANEL WITH GFR
ALBUMIN: 4.3 g/dL (ref 3.5–5.2)
ALT: 15 U/L (ref 0–35)
AST: 21 U/L (ref 0–37)
Alkaline Phosphatase: 64 U/L (ref 39–117)
BUN: 14 mg/dL (ref 6–23)
CALCIUM: 9.7 mg/dL (ref 8.4–10.5)
CHLORIDE: 99 meq/L (ref 96–112)
CO2: 25 meq/L (ref 19–32)
Creat: 0.94 mg/dL (ref 0.50–1.10)
GFR, EST AFRICAN AMERICAN: 83 mL/min
GFR, Est Non African American: 72 mL/min
Glucose, Bld: 92 mg/dL (ref 70–99)
POTASSIUM: 5 meq/L (ref 3.5–5.3)
SODIUM: 136 meq/L (ref 135–145)
TOTAL PROTEIN: 7.4 g/dL (ref 6.0–8.3)
Total Bilirubin: 0.6 mg/dL (ref 0.2–1.2)

## 2014-09-17 LAB — POCT CBC
GRANULOCYTE PERCENT: 72.5 % (ref 37–80)
HCT, POC: 40.8 % (ref 37.7–47.9)
Hemoglobin: 13.6 g/dL (ref 12.2–16.2)
Lymph, poc: 1.9 (ref 0.6–3.4)
MCH, POC: 30.5 pg (ref 27–31.2)
MCHC: 33.4 g/dL (ref 31.8–35.4)
MCV: 91.5 fL (ref 80–97)
MID (CBC): 0.7 (ref 0–0.9)
MPV: 5.9 fL (ref 0–99.8)
PLATELET COUNT, POC: 444 10*3/uL — AB (ref 142–424)
POC Granulocyte: 6.8 (ref 2–6.9)
POC LYMPH PERCENT: 19.8 %L (ref 10–50)
POC MID %: 7.7 % (ref 0–12)
RBC: 4.45 M/uL (ref 4.04–5.48)
RDW, POC: 13 %
WBC: 9.4 10*3/uL (ref 4.6–10.2)

## 2014-09-17 LAB — PROTIME-INR
INR: 0.99 (ref <1.50)
Prothrombin Time: 13.1 seconds (ref 11.6–15.2)

## 2014-09-17 LAB — POCT SEDIMENTATION RATE: POCT SED RATE: 19 mm/h (ref 0–22)

## 2014-09-17 NOTE — Telephone Encounter (Signed)
Left message to come in to be evaluated.

## 2014-09-17 NOTE — Progress Notes (Signed)
09/17/2014 at 6:27 PM  [name] / DOB: July 01, 1965 / MRN: [id number]  The patient has Infertility, female; Attention deficit disorder (ADD); TMJ (temporomandibular joint syndrome); Mixed hyperlipidemia; Insomnia; Allergic rhinitis, cause unspecified; Ache in joint; Arthralgia; Nonallopathic lesion of lumbosacral region; Nonallopathic lesion of sacral region; Nonallopathic lesion of thoracic region; and Hypermobility syndrome on her problem list.  SUBJECTIVE  Chief complaint: Urticaria; Abdominal Pain; Back Pain; and Medication Refill  Patient here for history of hives and now with some new bruising on her right medial thigh.  Reports she was on vacation in [place] with her husband and had an acute bout of abdominal pain associated with bilateral flank pain and she was told by the cruise ship nurse that is was probably her gall bladder.  She feels well today, and is urinating and defecating normally.  Denies fever but reports night sweats, however these are not new as she has a history of hot flashes. Denies any new medications at this time. Denies tic bites.  She is eating and drinking normally for her.   She  has a past medical history of Infertility, female; Attention deficit disorder (ADD); TMJ (temporomandibular joint syndrome); Mixed hyperlipidemia; Allergic rhinitis; and substance abuse.    Medications reviewed and updated by myself where necessary, and exist elsewhere in the encounter.   Ms. Montee has No Known Allergies. She  reports that she has been smoking Cigarettes.  She has a 5.6 pack-year smoking history. She does not have any smokeless tobacco history on file. She reports that she drinks alcohol. She reports that she does not use illicit drugs. She  has no sexual activity history on file. The patient  has no past surgical history on file.  Her family history includes Cancer in her sister; Heart disease in her father.  Review of Systems  Constitutional: Negative for fever  and chills.  Eyes: Negative for blurred vision.  Respiratory: Negative for cough.   Cardiovascular: Negative for chest pain and palpitations.  Gastrointestinal: Negative for nausea, abdominal pain, diarrhea, constipation and blood in stool.  Genitourinary: Negative for dysuria.  Skin: Positive for rash. Negative for itching.  Neurological: Negative for dizziness and headaches.  Psychiatric/Behavioral: Negative for depression.    OBJECTIVE  Her  height is 5' 9.5" (1.765 m) and weight is 145 lb (65.772 kg). Her oral temperature is 97.9 F (36.6 C). Her blood pressure is 118/68 and her pulse is 84. Her respiration is 17 and oxygen saturation is 98%.  The patient's body mass index is 21.11 kg/(m^2).  Physical Exam  Vitals reviewed. Constitutional: She is oriented to person, place, and time. She appears well-developed and well-nourished. No distress.  Cardiovascular: Normal rate and regular rhythm.   Respiratory: Effort normal and breath sounds normal.  Musculoskeletal: Normal range of motion.  Neurological: She is alert and oriented to person, place, and time.  Skin: Skin is warm and dry. She is not diaphoretic.  Psychiatric: She has a normal mood and affect.    Results for orders placed or performed in visit on 09/17/14 (from the past 24 hour(s))  COMPLETE METABOLIC PANEL WITH GFR     Status: None   Collection Time: 09/17/14 10:22 AM  Result Value Ref Range   Sodium 136 135 - 145 mEq/L   Potassium 5.0 3.5 - 5.3 mEq/L   Chloride 99 96 - 112 mEq/L   CO2 25 19 - 32 mEq/L   Glucose, Bld 92 70 - 99 mg/dL   BUN 14 6 -  23 mg/dL   Creat 0.94 0.50 - 1.10 mg/dL   Total Bilirubin 0.6 0.2 - 1.2 mg/dL   Alkaline Phosphatase 64 39 - 117 U/L   AST 21 0 - 37 U/L   ALT 15 0 - 35 U/L   Total Protein 7.4 6.0 - 8.3 g/dL   Albumin 4.3 3.5 - 5.2 g/dL   Calcium 9.7 8.4 - 10.5 mg/dL   GFR, Est African American 83 mL/min   GFR, Est Non African American 72 mL/min   Narrative   Performed at:   [facility], [address]                [place], [place] [address]  Protime-INR     Status: None   Collection Time: 09/17/14 10:22 AM  Result Value Ref Range   Prothrombin Time 13.1 11.6 - 15.2 seconds   INR 0.99 <1.50   Narrative   Performed at:  [facility], [address]                , [place] [address]  POCT CBC     Status: Abnormal   Collection Time: 09/17/14 10:32 AM  Result Value Ref Range   WBC 9.4 4.6 - 10.2 K/uL   Lymph, poc 1.9 0.6 - 3.4   POC LYMPH PERCENT 19.8 10 - 50 %L   MID (cbc) 0.7 0 - 0.9   POC MID % 7.7 0 - 12 %M   POC Granulocyte 6.8 2 - 6.9   Granulocyte percent 72.5 37 - 80 %G   RBC 4.45 4.04 - 5.48 M/uL   Hemoglobin 13.6 12.2 - 16.2 g/dL   HCT, POC 40.8 37.7 - 47.9 %   MCV 91.5 80 - 97 fL   MCH, POC 30.5 27 - 31.2 pg   MCHC 33.4 31.8 - 35.4 g/dL   RDW, POC 13.0 %   Platelet Count, POC 444 (A) 142 - 424 K/uL   MPV 5.9 0 - 99.8 fL  POCT urinalysis dipstick     Status: None   Collection Time: 09/17/14 10:32 AM  Result Value Ref Range   Color, UA yellow    Clarity, UA clear    Glucose, UA neg    Bilirubin, UA neg    Ketones, UA neg    Spec Grav, UA 1.015    Blood, UA neg    pH, UA 7.0    Protein, UA neg    Urobilinogen, UA 0.2    Nitrite, UA neg    Leukocytes, UA Negative Negative  POCT SEDIMENTATION RATE     Status: None   Collection Time: 09/17/14 11:39 AM  Result Value Ref Range   POCT SED RATE 19 0 - 22 mm/hr    ASSESSMENT & PLAN  Fayetta was seen today for urticaria, abdominal pain, back pain and medication refill.  Diagnoses and all orders for this visit:  Rash and nonspecific skin eruption Orders: -     POCT CBC -     COMPLETE METABOLIC PANEL WITH GFR -     Protime-INR -     POCT SEDIMENTATION RATE -     POCT urinalysis dipstick -     Ambulatory referral to Allergy  Thrombocytosis Orders: -     Pathologist smear review  The patient was  advised to call or come back to clinic if she does not see an improvement in symptoms, or worsens with the above plan.   Philis Fendt, MHS, PA-C [facility] 09/17/2014 6:27 PM

## 2014-09-18 ENCOUNTER — Other Ambulatory Visit: Payer: Self-pay | Admitting: Emergency Medicine

## 2014-09-18 LAB — PATHOLOGIST SMEAR REVIEW

## 2014-11-26 ENCOUNTER — Other Ambulatory Visit: Payer: Self-pay | Admitting: Physician Assistant

## 2014-12-11 ENCOUNTER — Ambulatory Visit (INDEPENDENT_AMBULATORY_CARE_PROVIDER_SITE_OTHER): Payer: BLUE CROSS/BLUE SHIELD | Admitting: Emergency Medicine

## 2014-12-11 ENCOUNTER — Encounter: Payer: Self-pay | Admitting: Emergency Medicine

## 2014-12-11 VITALS — BP 130/90 | HR 81 | Temp 98.4°F | Resp 16 | Ht 69.5 in | Wt 148.0 lb

## 2014-12-11 DIAGNOSIS — G4700 Insomnia, unspecified: Secondary | ICD-10-CM

## 2014-12-11 DIAGNOSIS — Z23 Encounter for immunization: Secondary | ICD-10-CM

## 2014-12-11 MED ORDER — DULOXETINE HCL 30 MG PO CPEP: 60.0000 mg | ORAL_CAPSULE | Freq: Every day | ORAL | Status: DC

## 2014-12-11 MED ORDER — CYCLOBENZAPRINE HCL 10 MG PO TABS: 10.0000 mg | ORAL_TABLET | Freq: Every day | ORAL | Status: AC

## 2014-12-11 MED ORDER — ZOLPIDEM TARTRATE 5 MG PO TABS: 5.0000 mg | ORAL_TABLET | Freq: Every evening | ORAL | Status: DC | PRN

## 2014-12-11 NOTE — Patient Instructions (Signed)
Please contact me in one month if the irritated area on your left cheek has not resolved. We will make a dermatology referral at that time.

## 2014-12-11 NOTE — Progress Notes (Addendum)
This chart was scribed for Connie Queen, MD by Moises Blood, Medical Scribe. This patient was seen in Room 22 and the patient's care was started 11:55 AM.  Chief Complaint:  Chief Complaint  Patient presents with  . Medication Refill    HPI: Connie CASTRONOVA is a 49 y.o. female who reports to [facility] today for medication refill.  She goes to [facility] annually. She has Fhx of breast cancer so has 6 month mammogram.   She tried cymbalta 30 mg and 60 mg to come down. But, she had neck surgery, and continued on 60 mg. She wants to try it again to lower the dose to 30 mg.   She still has some pain in her shoulder and her neck after her neck surgery. She's been doing acupuncture to help. She plans to return to exercises soon.  She also noticed some redness on her left cheek. She's tried applying some alcohol on the area. She denies any moles or other skin conditions.   She was recommended a flu shot and received one today.   Past Medical History  Diagnosis Date  . 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 Attention deficit disorder (ADD)   . TMJ (temporomandibular joint syndrome)   . Mixed hyperlipidemia   . Allergic rhinitis   . Hx of substance abuse     cocaine   No past surgical history on file. Social History   Social History  . Marital Status: Married    Spouse Name: N/A  . Number of Children: N/A  . Years of Education: N/A   Social History Main Topics  . Smoking status: Current Every Day Smoker -- 0.20 packs/day for 28 years    Types: Cigarettes  . Smokeless tobacco: None  . Alcohol Use: 0.0 oz/week    0 Standard drinks or equivalent per week  . Drug Use: No  . Sexual Activity: Not Asked   Other Topics Concern  . None   Social History Narrative   Family History  Problem Relation Age of Onset  . Heart disease Father   . Cancer Sister    No Known Allergies Prior to Admission medications   Medication Sig Start Date End Date Taking? Authorizing Provider    cyclobenzaprine (FLEXERIL) 10 MG tablet Take 1 tablet (10 mg total) by mouth at bedtime. Patient not taking: Reported on 09/17/2014 09/13/11   Darlyne Russian, MD  DULoxetine (CYMBALTA) 60 MG capsule Take 1 capsule (60 mg total) by mouth daily. NO MORE REFILLS WITHOUT MED REFILL CHECK UP - 2ND NOTICE 11/27/14   Darlyne Russian, MD  hydrOXYzine (ATARAX/VISTARIL) 25 MG tablet Take 1 tablet (25 mg total) by mouth every 8 (eight) hours as needed. Patient not taking: Reported on 09/17/2014 04/02/14   Lyndal Pulley, DO  zolpidem (AMBIEN) 5 MG tablet TAKE 1 TABLET BY MOUTH AT BEDTIME AS NEEDED FOR SLEEP 03/08/13   Darlyne Russian, MD     ROS:  Constitutional: negative for chills, fever, night sweats, weight changes, or fatigue  HEENT: negative for vision changes, hearing loss, congestion, rhinorrhea, ST, epistaxis, or sinus pressure Cardiovascular: negative for chest pain or palpitations Respiratory: negative for hemoptysis, wheezing, shortness of breath, or cough Abdominal: negative for abdominal pain, nausea, vomiting, diarrhea, or constipation Dermatological: positive for rash (left cheek) Neurologic: negative for headache, dizziness, or syncope All other systems reviewed and are otherwise negative with the exception to those above and in the HPI.  PHYSICAL EXAM:  Filed Vitals:   12/11/14 1140  BP: 130/90  Pulse: 81  Temp: 98.4 F (36.9 C)  Resp: 16   Body mass index is 21.55 kg/(m^2).   General: Alert, no acute distress HEENT:  Normocephalic, atraumatic, oropharynx patent. Eye: [facility] Cardiovascular:  Regular rate and rhythm, no rubs murmurs or gallops.  No Carotid bruits, radial pulse intact. No pedal edema.  Respiratory: Clear to auscultation bilaterally.  No wheezes, rales, or rhonchi.  No cyanosis, no use of accessory musculature Abdominal: No organomegaly, abdomen is soft and non-tender, positive bowel sounds. No masses. Musculoskeletal: Gait intact. No edema, tenderness Skin:  left cheek 1x1cm scaly red patch Neurologic: Facial musculature symmetric. Psychiatric: Patient acts appropriately throughout our interaction.  Lymphatic: No cervical or submandibular lymphadenopathy Genitourinary/Anorectal: No acute findings   LABS: Results for orders placed or performed in visit on 09/17/14  COMPLETE METABOLIC PANEL WITH GFR  Result Value Ref Range   Sodium 136 135 - 145 mEq/L   Potassium 5.0 3.5 - 5.3 mEq/L   Chloride 99 96 - 112 mEq/L   CO2 25 19 - 32 mEq/L   Glucose, Bld 92 70 - 99 mg/dL   BUN 14 6 - 23 mg/dL   Creat 0.94 0.50 - 1.10 mg/dL   Total Bilirubin 0.6 0.2 - 1.2 mg/dL   Alkaline Phosphatase 64 39 - 117 U/L   AST 21 0 - 37 U/L   ALT 15 0 - 35 U/L   Total Protein 7.4 6.0 - 8.3 g/dL   Albumin 4.3 3.5 - 5.2 g/dL   Calcium 9.7 8.4 - 10.5 mg/dL   GFR, Est African American 83 mL/min   GFR, Est Non African American 72 mL/min  Protime-INR  Result Value Ref Range   Prothrombin Time 13.1 11.6 - 15.2 seconds   INR 0.99 <1.50  Pathologist smear review  Result Value Ref Range   Path Review SEE NOTE   POCT CBC  Result Value Ref Range   WBC 9.4 4.6 - 10.2 K/uL   Lymph, poc 1.9 0.6 - 3.4   POC LYMPH PERCENT 19.8 10 - 50 %L   MID (cbc) 0.7 0 - 0.9   POC MID % 7.7 0 - 12 %M   POC Granulocyte 6.8 2 - 6.9   Granulocyte percent 72.5 37 - 80 %G   RBC 4.45 4.04 - 5.48 M/uL   Hemoglobin 13.6 12.2 - 16.2 g/dL   HCT, POC 40.8 37.7 - 47.9 %   MCV 91.5 80 - 97 fL   MCH, POC 30.5 27 - 31.2 pg   MCHC 33.4 31.8 - 35.4 g/dL   RDW, POC 13.0 %   Platelet Count, POC 444 (A) 142 - 424 K/uL   MPV 5.9 0 - 99.8 fL  POCT SEDIMENTATION RATE  Result Value Ref Range   POCT SED RATE 19 0 - 22 mm/hr  POCT urinalysis dipstick  Result Value Ref Range   Color, UA yellow    Clarity, UA clear    Glucose, UA neg    Bilirubin, UA neg    Ketones, UA neg    Spec Grav, UA 1.015    Blood, UA neg    pH, UA 7.0    Protein, UA neg    Urobilinogen, UA 0.2    Nitrite, UA neg     Leukocytes, UA Negative Negative   Results for orders placed or performed in visit on 09/17/14  COMPLETE METABOLIC PANEL WITH GFR  Result Value Ref Range  Sodium 136 135 - 145 mEq/L   Potassium 5.0 3.5 - 5.3 mEq/L   Chloride 99 96 - 112 mEq/L   CO2 25 19 - 32 mEq/L   Glucose, Bld 92 70 - 99 mg/dL   BUN 14 6 - 23 mg/dL   Creat 0.94 0.50 - 1.10 mg/dL   Total Bilirubin 0.6 0.2 - 1.2 mg/dL   Alkaline Phosphatase 64 39 - 117 U/L   AST 21 0 - 37 U/L   ALT 15 0 - 35 U/L   Total Protein 7.4 6.0 - 8.3 g/dL   Albumin 4.3 3.5 - 5.2 g/dL   Calcium 9.7 8.4 - 10.5 mg/dL   GFR, Est African American 83 mL/min   GFR, Est Non African American 72 mL/min  Protime-INR  Result Value Ref Range   Prothrombin Time 13.1 11.6 - 15.2 seconds   INR 0.99 <1.50  Pathologist smear review  Result Value Ref Range   Path Review SEE NOTE   POCT CBC  Result Value Ref Range   WBC 9.4 4.6 - 10.2 K/uL   Lymph, poc 1.9 0.6 - 3.4   POC LYMPH PERCENT 19.8 10 - 50 %L   MID (cbc) 0.7 0 - 0.9   POC MID % 7.7 0 - 12 %M   POC Granulocyte 6.8 2 - 6.9   Granulocyte percent 72.5 37 - 80 %G   RBC 4.45 4.04 - 5.48 M/uL   Hemoglobin 13.6 12.2 - 16.2 g/dL   HCT, POC 40.8 37.7 - 47.9 %   MCV 91.5 80 - 97 fL   MCH, POC 30.5 27 - 31.2 pg   MCHC 33.4 31.8 - 35.4 g/dL   RDW, POC 13.0 %   Platelet Count, POC 444 (A) 142 - 424 K/uL   MPV 5.9 0 - 99.8 fL  POCT SEDIMENTATION RATE  Result Value Ref Range   POCT SED RATE 19 0 - 22 mm/hr  POCT urinalysis dipstick  Result Value Ref Range   Color, UA yellow    Clarity, UA clear    Glucose, UA neg    Bilirubin, UA neg    Ketones, UA neg    Spec Grav, UA 1.015    Blood, UA neg    pH, UA 7.0    Protein, UA neg    Urobilinogen, UA 0.2    Nitrite, UA neg    Leukocytes, UA Negative Negative   Meds ordered this encounter  Medications  . Lisdexamfetamine Dimesylate (VYVANSE) 10 MG CAPS    Sig: Take by mouth.  . zolpidem (AMBIEN) 5 MG tablet    Sig: Take 1 tablet (5 mg  total) by mouth at bedtime as needed. for sleep    Dispense:  30 tablet    Refill:  2  . DULoxetine (CYMBALTA) 30 MG capsule    Sig: Take 2 capsules (60 mg total) by mouth daily. Take 2 capsules a day    Dispense:  60 capsule    Refill:  11  . cyclobenzaprine (FLEXERIL) 10 MG tablet    Sig: Take 1 tablet (10 mg total) by mouth at bedtime.    Dispense:  30 tablet    Refill:  2    EKG/XRAY:   Primary read interpreted by Dr. Everlene Farrier at [facility].   ASSESSMENT/PLAN: Patient doing well. I told her Cymbalta as well as Ambien and Flexeril. She is going to wean down off of her Cymbalta. She will start by taking 2 capsules to alternate with 1 capsule daily.  I told her she needed to wean down very slowly over the next few months. Recheck in 3 months. Do fasting labs at that time. She does have a lesion on her left cheek. She will see if this resolves over the next month. If it does not we'll get her to dermatology for their opinion. She agrees to call me in one month if it has not resolved.  By signing my name below, I, Moises Blood, attest that this documentation has been prepared under the direction and in the presence of Connie Queen, MD. Electronically Signed: Moises Blood, Mona. 12/11/2014 , 12:08 PM .  I personally performed the services described in this documentation, which was scribed in my presence. The recorded information has been reviewed and is accurate.  Gross sideeffects, risk and benefits, and alternatives of medications d/w patient. Patient is aware that all medications have potential sideeffects and we are unable to predict every sideeffect or drug-drug interaction that may occur.  Connie Queen MD 12/11/2014 12:08 PM

## 2014-12-16 ENCOUNTER — Encounter: Payer: Self-pay | Admitting: Emergency Medicine

## 2015-01-12 ENCOUNTER — Other Ambulatory Visit: Payer: Self-pay | Admitting: Obstetrics and Gynecology

## 2015-01-12 DIAGNOSIS — N644 Mastodynia: Secondary | ICD-10-CM

## 2015-01-15 ENCOUNTER — Ambulatory Visit
Admission: RE | Admit: 2015-01-15 | Discharge: 2015-01-15 | Disposition: A | Discharge status: Home / Self Care | Payer: BLUE CROSS/BLUE SHIELD | Source: Ambulatory Visit | Attending: Obstetrics and Gynecology | Admitting: Obstetrics and Gynecology

## 2015-01-15 DIAGNOSIS — N644 Mastodynia: Secondary | ICD-10-CM

## 2015-03-17 ENCOUNTER — Other Ambulatory Visit: Payer: Self-pay | Admitting: Emergency Medicine

## 2015-06-11 ENCOUNTER — Ambulatory Visit: Payer: Self-pay | Admitting: Emergency Medicine

## 2015-06-24 ENCOUNTER — Encounter (HOSPITAL_COMMUNITY): Payer: Self-pay

## 2015-06-24 ENCOUNTER — Emergency Department (HOSPITAL_COMMUNITY)
Admission: EM | Admit: 2015-06-24 | Discharge: 2015-06-24 | Disposition: A | Discharge status: Home / Self Care | Payer: BLUE CROSS/BLUE SHIELD | Attending: Emergency Medicine | Admitting: Emergency Medicine

## 2015-06-24 ENCOUNTER — Emergency Department (HOSPITAL_COMMUNITY): Payer: BLUE CROSS/BLUE SHIELD

## 2015-06-24 DIAGNOSIS — F17210 Nicotine dependence, cigarettes, uncomplicated: Secondary | ICD-10-CM | POA: Insufficient documentation

## 2015-06-24 DIAGNOSIS — Z8639 Personal history of other endocrine, nutritional and metabolic disease: Secondary | ICD-10-CM | POA: Diagnosis not present

## 2015-06-24 DIAGNOSIS — R079 Chest pain, unspecified: Secondary | ICD-10-CM | POA: Diagnosis present

## 2015-06-24 DIAGNOSIS — Z8739 Personal history of other diseases of the musculoskeletal system and connective tissue: Secondary | ICD-10-CM | POA: Insufficient documentation

## 2015-06-24 DIAGNOSIS — Z79899 Other long term (current) drug therapy: Secondary | ICD-10-CM | POA: Insufficient documentation

## 2015-06-24 DIAGNOSIS — F909 Attention-deficit hyperactivity disorder, unspecified type: Secondary | ICD-10-CM | POA: Insufficient documentation

## 2015-06-24 DIAGNOSIS — Z8742 Personal history of other diseases of the female genital tract: Secondary | ICD-10-CM | POA: Insufficient documentation

## 2015-06-24 LAB — CBC
HCT: 37.5 % (ref 36.0–46.0)
HEMOGLOBIN: 12.9 g/dL (ref 12.0–15.0)
MCH: 31.2 pg (ref 26.0–34.0)
MCHC: 34.4 g/dL (ref 30.0–36.0)
MCV: 90.6 fL (ref 78.0–100.0)
Platelets: 221 10*3/uL (ref 150–400)
RBC: 4.14 MIL/uL (ref 3.87–5.11)
RDW: 12.3 % (ref 11.5–15.5)
WBC: 3.3 10*3/uL — AB (ref 4.0–10.5)

## 2015-06-24 LAB — COMPREHENSIVE METABOLIC PANEL
ALK PHOS: 70 U/L (ref 38–126)
ALT: 18 U/L (ref 14–54)
AST: 21 U/L (ref 15–41)
Albumin: 3.6 g/dL (ref 3.5–5.0)
Anion gap: 11 (ref 5–15)
BUN: 9 mg/dL (ref 6–20)
CALCIUM: 9.3 mg/dL (ref 8.9–10.3)
CO2: 24 mmol/L (ref 22–32)
CREATININE: 0.97 mg/dL (ref 0.44–1.00)
Chloride: 98 mmol/L — ABNORMAL LOW (ref 101–111)
Glucose, Bld: 104 mg/dL — ABNORMAL HIGH (ref 65–99)
Potassium: 3.9 mmol/L (ref 3.5–5.1)
SODIUM: 133 mmol/L — AB (ref 135–145)
Total Bilirubin: 0.2 mg/dL — ABNORMAL LOW (ref 0.3–1.2)
Total Protein: 6.7 g/dL (ref 6.5–8.1)

## 2015-06-24 LAB — I-STAT TROPONIN, ED
TROPONIN I, POC: 0 ng/mL (ref 0.00–0.08)
Troponin i, poc: 0 ng/mL (ref 0.00–0.08)

## 2015-06-24 LAB — LIPASE, BLOOD: Lipase: 39 U/L (ref 11–51)

## 2015-06-24 MED ORDER — GI COCKTAIL ~~LOC~~
30.0000 mL | Freq: Once | ORAL | Status: AC
  Administered 2015-06-24: 30 mL via ORAL
  Filled 2015-06-24: qty 30

## 2015-06-24 MED ORDER — SODIUM CHLORIDE 0.9 % IV BOLUS (SEPSIS)
1000.0000 mL | Freq: Once | INTRAVENOUS | Status: AC
  Administered 2015-06-24: 1000 mL via INTRAVENOUS

## 2015-06-24 MED ORDER — PANTOPRAZOLE SODIUM 20 MG PO TBEC: 20.0000 mg | DELAYED_RELEASE_TABLET | Freq: Every day | ORAL | Status: DC

## 2015-06-24 MED ORDER — PANTOPRAZOLE SODIUM 40 MG IV SOLR
40.0000 mg | Freq: Once | INTRAVENOUS | Status: AC
  Administered 2015-06-24: 40 mg via INTRAVENOUS
  Filled 2015-06-24: qty 40

## 2015-06-24 NOTE — ED Notes (Signed)
Pt presents with 3 day h/o epigastric pain.  Pt describes as a stabbing, reports pain is constant.

## 2015-06-24 NOTE — ED Provider Notes (Signed)
CSN: [id number]     Arrival date & time 06/24/15  1017 History   First MD Initiated Contact with Patient 06/24/15 1037     Chief Complaint: Chest Pain  HPI   Connie Bond is a 50 y.o. female PMH significant for hyperlipidemia, cocaine abuse presenting with a 3 day history of epigastric pain. She describes the pain as 3/10 pain scale, constant, spasm, non-radiating, not associated with foods/BM, non-exertional. She endorses associated nausea. She denies fevers, chills, SOB, emesis, changes in bowel/bladder habits, recent cocaine use.   Past Medical History  Diagnosis Date  . 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 Attention deficit disorder (ADD)   . TMJ (temporomandibular joint syndrome)   . Mixed hyperlipidemia   . Allergic rhinitis   . Hx of substance abuse     cocaine   History reviewed. No pertinent past surgical history. Family History  Problem Relation Age of Onset  . Heart disease Father   . Cancer Sister    Social History  Substance Use Topics  . Smoking status: Current Every Day Smoker -- 0.20 packs/day for 28 years    Types: Cigarettes  . Smokeless tobacco: None  . Alcohol Use: 0.0 oz/week    0 Standard drinks or equivalent per week   OB History    No data available     Review of Systems  Ten systems are reviewed and are negative for acute change except as noted in the HPI  Allergies  Review of patient's allergies indicates no known allergies.  Home Medications   Prior to Admission medications   Medication Sig Start Date End Date Taking? Authorizing Provider  desvenlafaxine (PRISTIQ) 100 MG 24 hr tablet Take 100 mg by mouth daily.   Yes Historical Provider, MD  hydrOXYzine (ATARAX/VISTARIL) 25 MG tablet Take 25-50 mg by mouth 3 (three) times daily as needed.   Yes Historical Provider, MD  methylphenidate 18 MG PO CR tablet Take 18 mg by mouth daily.   Yes Historical Provider, MD  Multiple Vitamin (MULTIVITAMIN WITH MINERALS) TABS tablet Take 1 tablet  by mouth daily.   Yes Historical Provider, MD  cyclobenzaprine (FLEXERIL) 10 MG tablet Take 1 tablet (10 mg total) by mouth at bedtime. Patient not taking: Reported on 06/24/2015 12/11/14   Darlyne Russian, MD  DULoxetine (CYMBALTA) 60 MG capsule TAKE 1 [place] Patient not taking: Reported on 06/24/2015 03/17/15   Darlyne Russian, MD  Lisdexamfetamine Dimesylate (VYVANSE) 10 MG CAPS Take by mouth.    Historical Provider, MD  zolpidem (AMBIEN) 5 MG tablet Take 1 tablet (5 mg total) by mouth at bedtime as needed. for sleep Patient not taking: Reported on 06/24/2015 12/11/14   Darlyne Russian, MD   BP 123/92 mmHg  Pulse 83  Temp(Src) 98.9 F (37.2 C) (Oral)  Resp 18  Ht 5\' 11"  (1.803 m)  Wt 70.308 kg  BMI 21.63 kg/m2  SpO2 100% Physical Exam  Constitutional: She appears well-developed and well-nourished. No distress.  HENT:  Head: Normocephalic and atraumatic.  Mouth/Throat: Oropharynx is clear and moist. No oropharyngeal exudate.  Eyes: Conjunctivae are normal. Pupils are equal, round, and reactive to light. Right eye exhibits no discharge. Left eye exhibits no discharge. No scleral icterus.  Neck: No tracheal deviation present.  Cardiovascular: Normal rate, regular rhythm, normal heart sounds and intact distal pulses.  Exam reveals no gallop and no friction rub.   No murmur heard. Pulmonary/Chest: Effort normal and breath sounds normal. No  respiratory distress. She has no wheezes. She has no rales. She exhibits no tenderness.  Abdominal: Soft. Bowel sounds are normal. She exhibits no distension and no mass. There is no tenderness. There is no rebound and no guarding.  Musculoskeletal: She exhibits no edema.  Lymphadenopathy:    She has no cervical adenopathy.  Neurological: She is alert. Coordination normal.  Skin: Skin is warm and dry. No rash noted. She is not diaphoretic. No erythema.  Psychiatric: She has a normal mood and affect. Her behavior is normal.  Nursing note and vitals  reviewed.   ED Course  Procedures  Labs Review Labs Reviewed  CBC - Abnormal; Notable for the following:    WBC 3.3 (*)    All other components within normal limits  COMPREHENSIVE METABOLIC PANEL - Abnormal; Notable for the following:    Sodium 133 (*)    Chloride 98 (*)    Glucose, Bld 104 (*)    Total Bilirubin 0.2 (*)    All other components within normal limits  LIPASE, BLOOD  I-STAT TROPOININ, ED  Randolm Idol, ED    Imaging Review Dg Chest 2 View  06/24/2015  CLINICAL DATA:  Chest and epigastric pain for 3 days EXAM: CHEST  2 VIEW COMPARISON:  None. FINDINGS: No active infiltrate or effusion is seen. A calcified granuloma is present in the anterior right upper lobe from prior granulomatous disease. Mediastinal and hilar contours are unremarkable. The heart is within normal limits in size. There may be a calcified right azygos node as well. No bony abnormality is seen. IMPRESSION: 1. No active cardiopulmonary disease. 2. Calcified right upper lobe granuloma and calcified hilar -paratracheal nodes consistent with prior granulomatous disease. Electronically Signed   By: Ivar Drape M.D.   On: 06/24/2015 12:04   Dg Abd 1 View  06/24/2015  CLINICAL DATA:  Three day history of epigastric and chest pain EXAM: ABDOMEN - 1 VIEW COMPARISON:  None. FINDINGS: The supine film of the abdomen shows a nonspecific bowel gas pattern. No opaque calculi are seen. The bones are unremarkable. IMPRESSION: No bowel obstruction.  No opaque calculi. Electronically Signed   By: Ivar Drape M.D.   On: 06/24/2015 12:04   I have personally reviewed and evaluated these images and lab results as part of my medical decision-making.   EKG Interpretation   Date/Time:  Wednesday June 24 2015 10:31:16 EDT Ventricular Rate:  74 PR Interval:  168 QRS Duration: 94 QT Interval:  391 QTC Calculation: 434 R Axis:   69 Text Interpretation:  Sinus rhythm Normal ECG No previous tracing  Confirmed by Maryan Rued   MD, Loree Fee ([id number]) on 06/24/2015 2:26:33 PM      MDM   Final diagnoses:  Chest pain, unspecified chest pain type   Patient nontoxic appearing, VSS. Will workup for GI vs cardiac (although this would be atypical). PERC negative.  EKG, troponin x 2, CBC, CMP, lipase unremarkable. CXR demonstrates calcified right upper lobe granuloma and calcified hilar-paratracheal nodes consistent with prior granulomatous disease. Abdominal xray unremarkable.  Patient feeling improved after IVF, GI cocktail and protonix. Patient may be safely discharged home with Protonix. Discussed reasons for return. Patient to follow-up with primary care provider and GI. Patient in understanding and agreement with the plan. Case discussed with Dr. Maryan Rued who agrees with above plan.    [place] Lions, PA-C 06/29/15 1106  Blanchie Dessert, MD 07/01/15 1425

## 2015-06-24 NOTE — Discharge Instructions (Signed)
Ms. MARRIANNE SUSALLA,  Nice meeting you! Please follow-up with your primary care provider and gastroenterology. Return to the emergency department if you develop increased chest pain, shortness of breath, or new/worsening symptoms. Feel better soon!  S. Wendie Simmer, PA-C

## 2015-06-29 ENCOUNTER — Encounter: Payer: Self-pay | Admitting: Gastroenterology

## 2015-06-29 ENCOUNTER — Ambulatory Visit (INDEPENDENT_AMBULATORY_CARE_PROVIDER_SITE_OTHER): Payer: BLUE CROSS/BLUE SHIELD | Admitting: Gastroenterology

## 2015-06-29 VITALS — BP 126/80 | HR 100 | Ht 71.0 in | Wt 146.6 lb

## 2015-06-29 DIAGNOSIS — K219 Gastro-esophageal reflux disease without esophagitis: Secondary | ICD-10-CM | POA: Diagnosis not present

## 2015-06-29 DIAGNOSIS — R1013 Epigastric pain: Secondary | ICD-10-CM

## 2015-06-29 DIAGNOSIS — R0789 Other chest pain: Secondary | ICD-10-CM

## 2015-06-29 MED ORDER — PANTOPRAZOLE SODIUM 20 MG PO TBEC: 20.0000 mg | DELAYED_RELEASE_TABLET | Freq: Every day | ORAL | Status: DC

## 2015-06-29 NOTE — Progress Notes (Signed)
Connie Bond    [id number]    06-07-1965  Primary Care Physician:DAUB, Lina Sayre, MD  Referring Physician: Darlyne Russian, MD [address] [place], [place] [address]  Chief complaint: Epigastric pain  HPI: 50 year old female here for new patient evaluation with complaints of epigastric pain. She develops severe cramping epigastric pain last week, had an ER visit on April 12. She had normal labs, chest x-ray and troponin level. She was discharged on Protonix daily. She had a similar episode last summer while on vacation. She drinks about 3 glasses of wine daily and sometimes does drink more. She also has history of cocaine abuse. Denies any dyspnea on exertion. No dysphagia or odynophagia. No other GI symptoms. She has not had a cardiac stress test or cardiac evaluation   Outpatient Encounter Prescriptions as of 06/29/2015  Medication Sig  . Ascorbic Acid (VITAMIN C) 1000 MG tablet Take 1,000 mg by mouth daily.  . Calcium-Magnesium-Zinc 167-83-8 MG TABS Take 1 tablet by mouth daily.  . COLLAGEN PO Take 6,000 mg by mouth daily.  . cyclobenzaprine (FLEXERIL) 10 MG tablet Take 1 tablet (10 mg total) by mouth at bedtime.  Marland Kitchen desvenlafaxine (PRISTIQ) 100 MG 24 hr tablet Take 100 mg by mouth daily.  . methylphenidate 18 MG PO CR tablet Take 18 mg by mouth daily.  Marland Kitchen MILK THISTLE PO Take 400 mg by mouth daily.  . Misc Natural Products (TURMERIC CURCUMIN) CAPS Take 1 capsule by mouth daily.  . Multiple Vitamin (MULTIVITAMIN WITH MINERALS) TABS tablet Take 1 tablet by mouth daily.  . pantoprazole (PROTONIX) 20 MG tablet Take 1 tablet (20 mg total) by mouth daily.  . Probiotic Product (PROBIOTIC ADVANCED) CAPS Take 1 capsule by mouth daily.  . [DISCONTINUED] pantoprazole (PROTONIX) 20 MG tablet Take 1 tablet (20 mg total) by mouth daily.  . [DISCONTINUED] DULoxetine (CYMBALTA) 60 MG capsule TAKE 1 CAPSULE EVERY DAY (Patient not taking: Reported on 06/24/2015)  . [DISCONTINUED]  hydrOXYzine (ATARAX/VISTARIL) 25 MG tablet Take 25-50 mg by mouth 3 (three) times daily as needed.  . [DISCONTINUED] Lisdexamfetamine Dimesylate (VYVANSE) 10 MG CAPS Take by mouth.  . [DISCONTINUED] zolpidem (AMBIEN) 5 MG tablet Take 1 tablet (5 mg total) by mouth at bedtime as needed. for sleep (Patient not taking: Reported on 06/24/2015)   No facility-administered encounter medications on file as of 06/29/2015.    Allergies as of 06/29/2015  . (No Known Allergies)    Past Medical History  Diagnosis Date  . 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 Attention deficit disorder (ADD)   . TMJ (temporomandibular joint syndrome)   . Mixed hyperlipidemia   . Allergic rhinitis   . Hx of substance abuse     cocaine    History reviewed. No pertinent past surgical history.  Family History  Problem Relation Age of Onset  . Heart disease Father   . Cancer Sister     Social History   Social History  . Marital Status: Married    Spouse Name: N/A  . Number of Children: 1  . Years of Education: N/A   Occupational History  . Homemaker    Social History Main Topics  . Smoking status: Current Every Day Smoker -- 0.20 packs/day for 28 years    Types: Cigarettes  . Smokeless tobacco: Never Used  . Alcohol Use: 0.0 oz/week    0 Standard drinks or equivalent per week  . Drug Use: No  .  Sexual Activity: Not on file   Other Topics Concern  . Not on file   Social History Narrative      Review of systems: Review of Systems  Constitutional: Negative for fever and chills.  HENT: Negative.   Eyes: Negative for blurred vision.  Respiratory: Negative for cough, shortness of breath and wheezing.   Cardiovascular: Negative for chest pain and palpitations.  Gastrointestinal: as per HPI Genitourinary: Negative for dysuria, urgency, frequency and hematuria.  Musculoskeletal: Negative for myalgias, back pain and joint pain.  Skin: Negative for itching and rash.  Neurological: Negative for  dizziness, tremors, focal weakness, seizures and loss of consciousness.  Endo/Heme/Allergies: Negative for environmental allergies.  Psychiatric/Behavioral: Negative for depression, suicidal ideas and hallucinations.  All other systems reviewed and are negative.   Physical Exam: Filed Vitals:   06/29/15 1518  BP: 126/80  Pulse: 100   Gen:      No acute distress HEENT:  EOMI, sclera anicteric Neck:     No masses; no thyromegaly Lungs:    Clear to auscultation bilaterally; normal respiratory effort CV:         Regular rate and rhythm; no murmurs Abd:      + bowel sounds; soft, non-tender; no palpable masses, no distension Ext:    No edema; adequate peripheral perfusion Skin:      Warm and dry; no rash Neuro: alert and oriented x 3 Psych: normal mood and affect  Data Reviewed:  Reviewed chart in epic   Assessment and Plan/Recommendations:  50 year old femaleWith complaints of epigastric pain in the setting of chronic alcohol use and cocaine use Cardiology evaluation to rule out cardiac etiology for atypical chest pain Continue Protonix once a day, 30 minutes before breakfast Abstain from cocaine and alcohol We'll obtain abdominal ultrasound to evaluate for possible gallbladder disease Return in 2 months, if continues to have persistent epigastric pain will consider EGD to evaluate for possible peptic ulcer  K. Denzil Magnuson , MD [phone] Mon-Fri 8a-5p [phone] after 5p, weekends, holidays

## 2015-06-29 NOTE — Patient Instructions (Signed)
You have been scheduled for an abdominal ultrasound at [facility] Radiology (1st floor of hospital) on 07/03/2015 at 9:30am . Please arrive 15 minutes prior to your appointment for registration. Make certain not to have anything to eat or drink 6 hours prior to your appointment. Should you need to reschedule your appointment, please contact radiology at [phone]. This test typically takes about 30 minutes to perform.  We will refill your protonix today

## 2015-07-03 ENCOUNTER — Ambulatory Visit (HOSPITAL_COMMUNITY): Payer: BLUE CROSS/BLUE SHIELD

## 2015-07-03 ENCOUNTER — Ambulatory Visit (HOSPITAL_COMMUNITY)
Admission: RE | Admit: 2015-07-03 | Discharge: 2015-07-03 | Disposition: A | Discharge status: Home / Self Care | Payer: BLUE CROSS/BLUE SHIELD | Source: Ambulatory Visit | Attending: Gastroenterology | Admitting: Gastroenterology

## 2015-07-03 DIAGNOSIS — R1013 Epigastric pain: Secondary | ICD-10-CM | POA: Insufficient documentation

## 2015-07-03 DIAGNOSIS — R160 Hepatomegaly, not elsewhere classified: Secondary | ICD-10-CM | POA: Insufficient documentation

## 2015-07-06 ENCOUNTER — Ambulatory Visit (HOSPITAL_COMMUNITY): Payer: BLUE CROSS/BLUE SHIELD

## 2015-08-31 ENCOUNTER — Ambulatory Visit: Payer: BLUE CROSS/BLUE SHIELD | Admitting: Gastroenterology

## 2015-11-05 ENCOUNTER — Telehealth: Payer: Self-pay | Admitting: Gastroenterology

## 2015-11-05 ENCOUNTER — Other Ambulatory Visit: Payer: Self-pay

## 2015-11-05 DIAGNOSIS — K769 Liver disease, unspecified: Secondary | ICD-10-CM

## 2015-11-05 NOTE — Telephone Encounter (Signed)
She is due a follow up u/s of the abdomen in October. I left her a message to call back and we can get this scheduled.

## 2015-11-05 NOTE — Telephone Encounter (Signed)
Spoke with the patient. Scheduled for 12/15/15 at 9:30 am. Arrive at 9:15 am to [facility] fasting after midnight. Also has the number to scheduling if she needs to change the appointment.

## 2015-12-15 ENCOUNTER — Ambulatory Visit (HOSPITAL_COMMUNITY): Payer: 59

## 2015-12-18 ENCOUNTER — Ambulatory Visit (HOSPITAL_COMMUNITY): Payer: 59

## 2015-12-21 ENCOUNTER — Ambulatory Visit (HOSPITAL_COMMUNITY): Admission: RE | Admit: 2015-12-21 | Payer: 59 | Source: Ambulatory Visit

## 2015-12-28 ENCOUNTER — Ambulatory Visit (HOSPITAL_COMMUNITY)
Admission: RE | Admit: 2015-12-28 | Discharge: 2015-12-28 | Disposition: A | Discharge status: Home / Self Care | Payer: 59 | Source: Ambulatory Visit | Attending: Gastroenterology | Admitting: Gastroenterology

## 2015-12-28 DIAGNOSIS — K769 Liver disease, unspecified: Secondary | ICD-10-CM | POA: Insufficient documentation

## 2016-04-03 ENCOUNTER — Other Ambulatory Visit: Payer: Self-pay | Admitting: Gastroenterology

## 2016-11-20 IMAGING — US US ABDOMEN COMPLETE
1 series · 14 of 25 positions shown · non-contrast
Comparison: 07/03/2015.

CLINICAL DATA: Follow-up liver lesion.

EXAM:
ABDOMEN ULTRASOUND COMPLETE

[Series 1: us abdomen complete · 0.20mm/px · 14 of 90 slices shown]
[im 1/90]
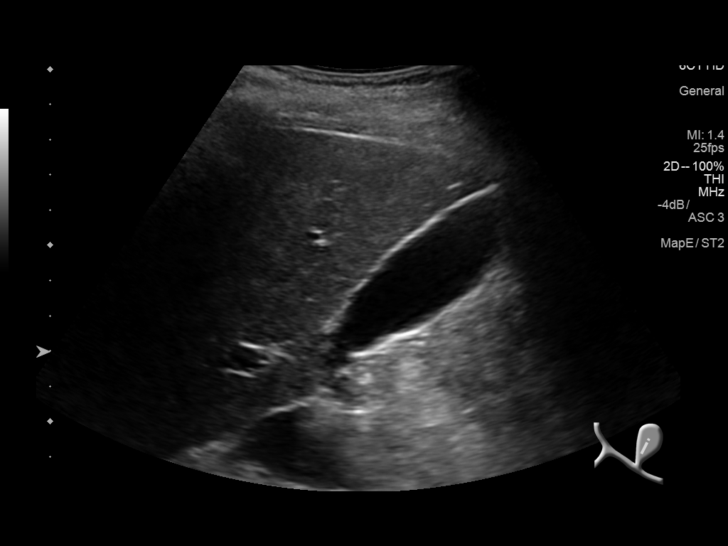
[im 8/90]
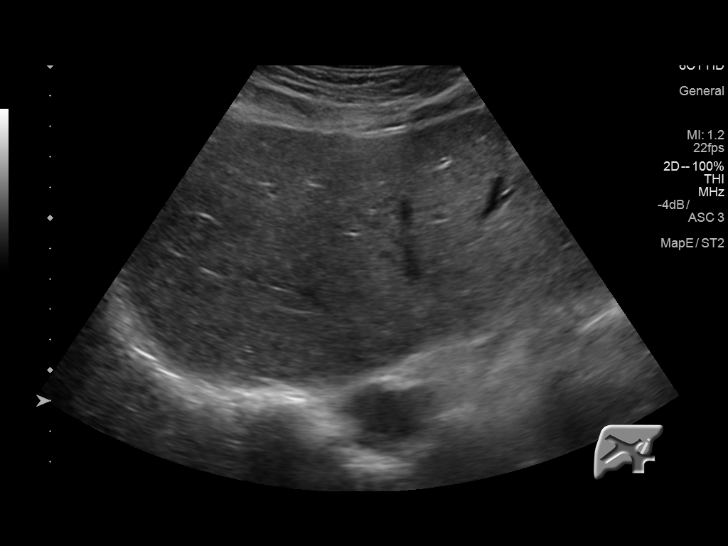
[im 15/90]
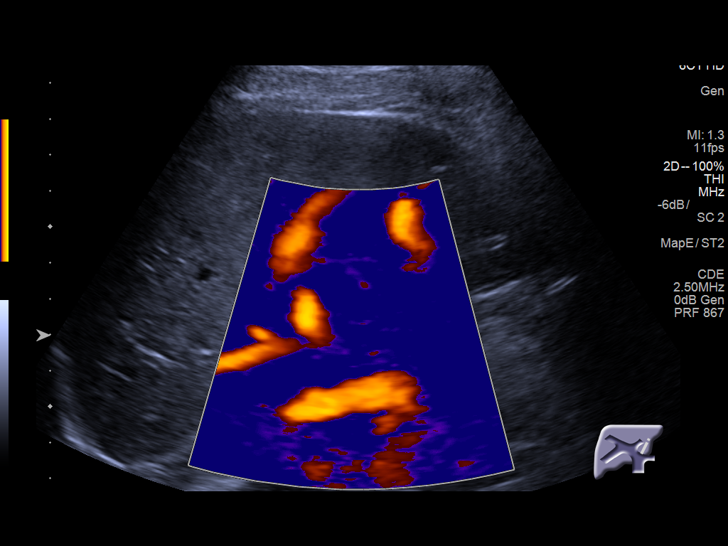
[im 23/90]
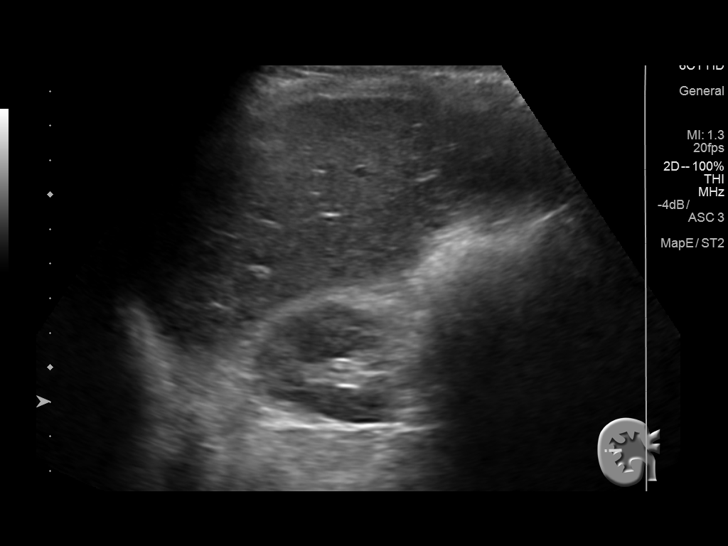
[im 30/90]
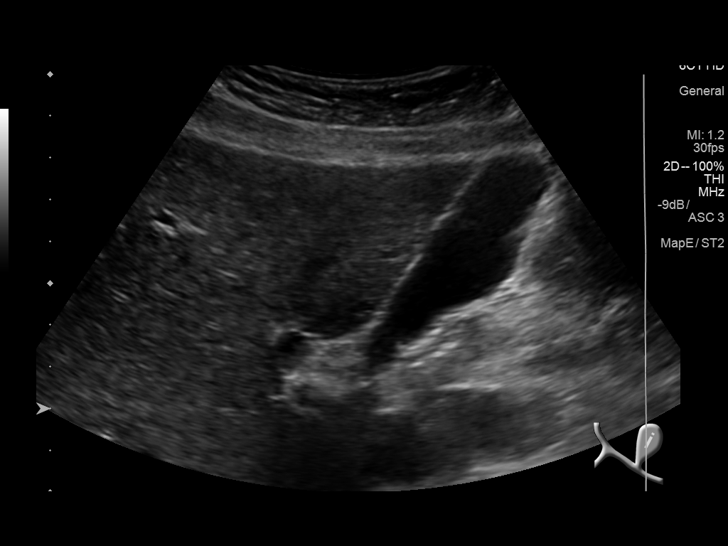
[im 34/90]
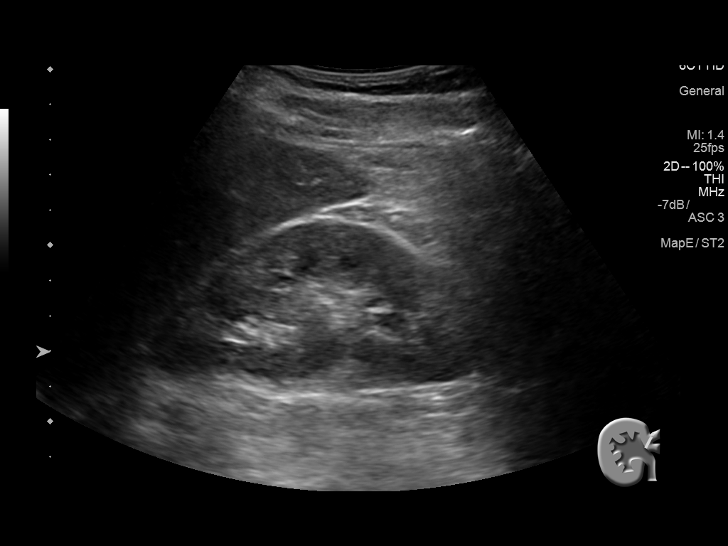
[im 41/90]
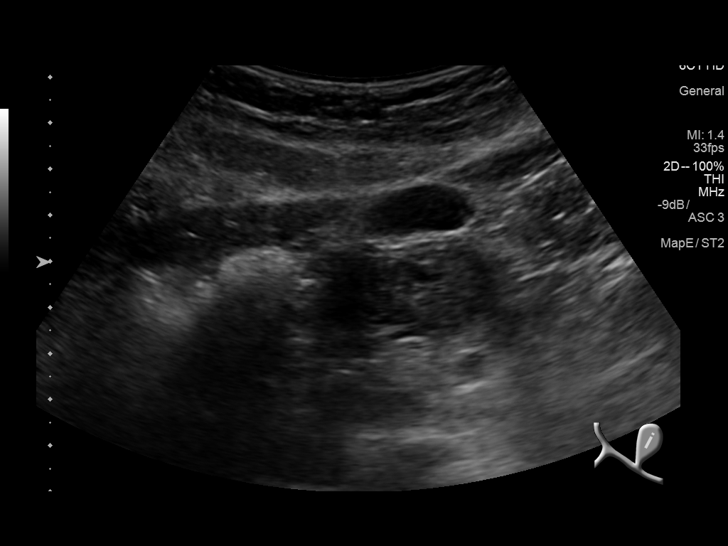
[im 49/90]
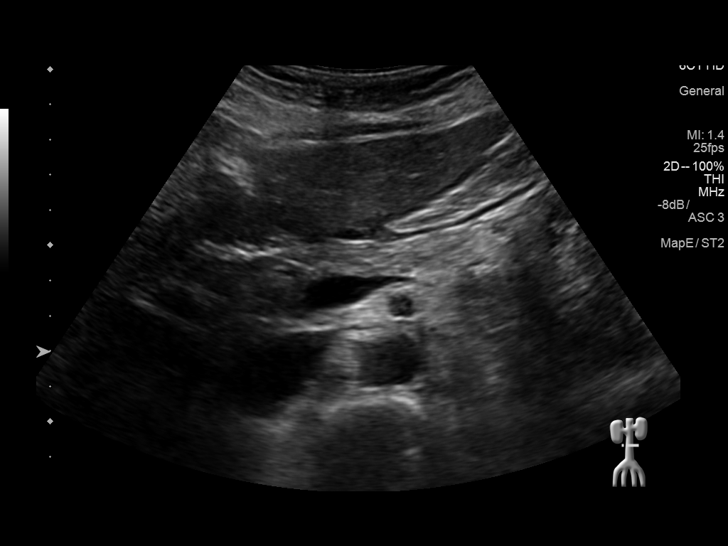
[im 56/90]
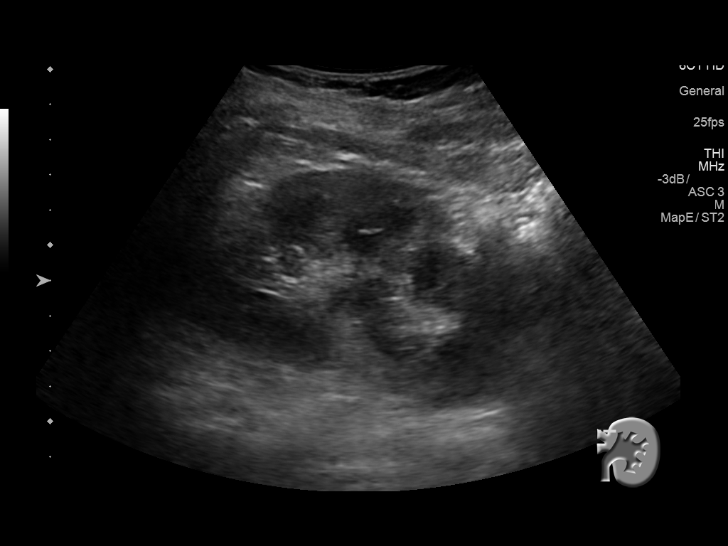
[im 60/90]
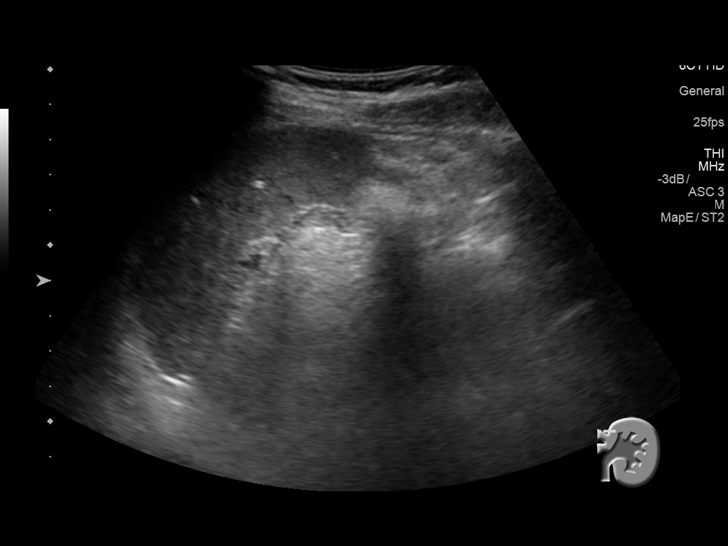
[im 67/90]
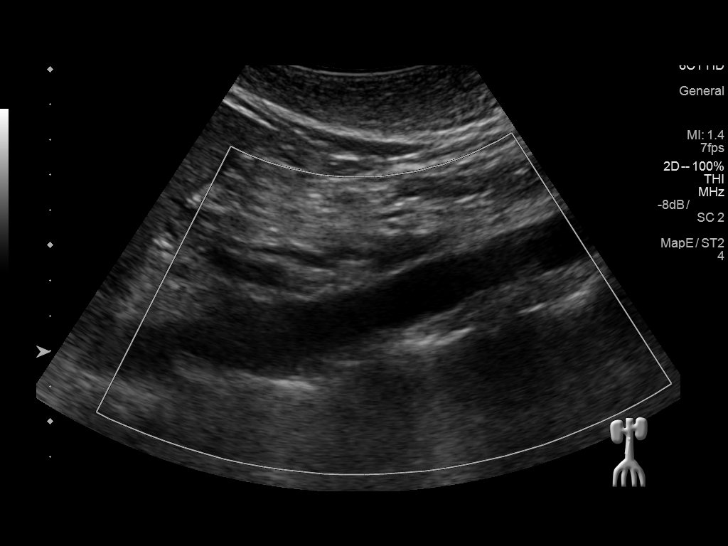
[im 75/90]
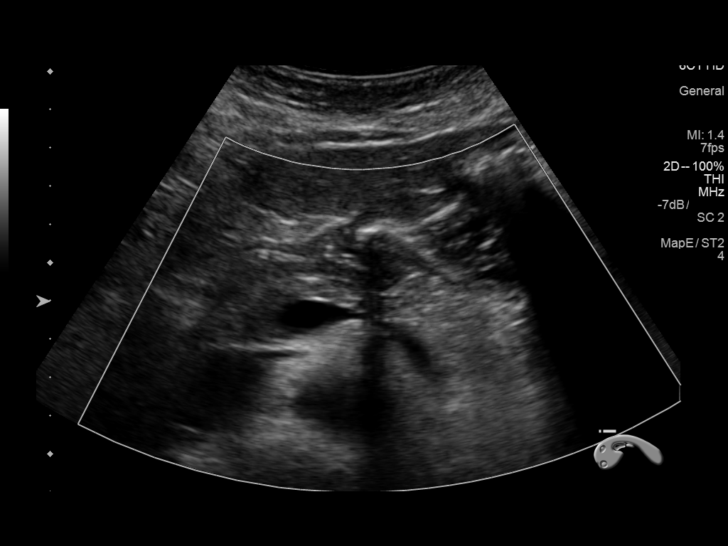
[im 82/90]
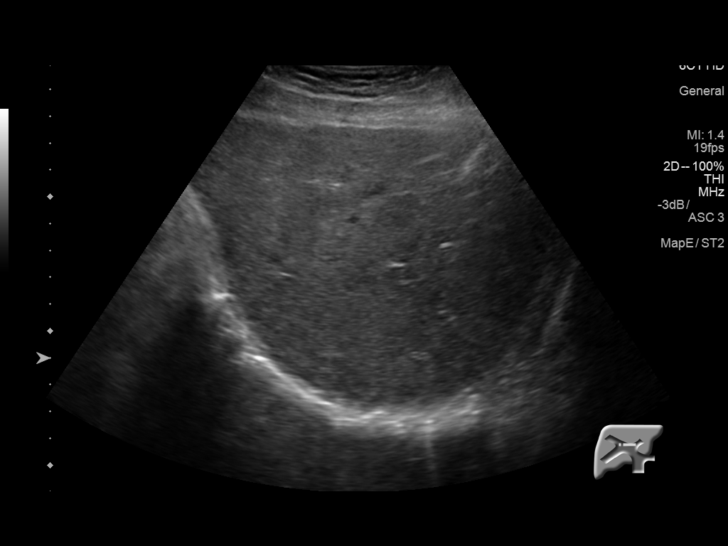
[im 90/90]
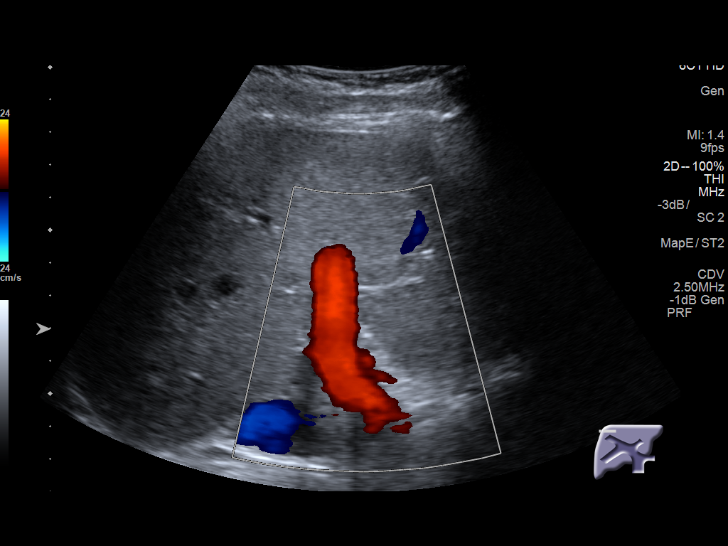

[14 of 25 positions shown; findings below may reference images not displayed]

FINDINGS: Gallbladder: No gallstones or wall thickening visualized. No
sonographic Murphy sign noted by sonographer.

Common bile duct: Diameter: 4.0 mm

Liver: 1.1 cm well-circumscribed hyperechoic lesion is in the right
hepatic lobe. Statistically this is most likely a benign hemangioma.
Similar finding noted on prior exam.

IVC: No abnormality visualized.

Pancreas: Visualized portion unremarkable.

Spleen: Size and appearance within normal limits.

Right Kidney: Length: 9.7 cm. Echogenicity within normal limits. No
mass or hydronephrosis visualized.

Left Kidney: Length: 10.4 cm. Echogenicity within normal limits. No
mass or hydronephrosis visualized.

Abdominal aorta: No aneurysm visualized.

Other findings: None.
IMPRESSION: 1. Stable well-circumscribed hyperechoic lesion in the right hepatic
lobe, statistically most likely a hemangioma. No change from
07/03/2015. 2. No acute abnormality.

## 2016-12-08 ENCOUNTER — Other Ambulatory Visit: Payer: Self-pay | Admitting: Gastroenterology

## 2018-01-30 ENCOUNTER — Other Ambulatory Visit: Payer: Self-pay | Admitting: Student

## 2018-01-30 DIAGNOSIS — M5412 Radiculopathy, cervical region: Secondary | ICD-10-CM

## 2018-02-19 ENCOUNTER — Other Ambulatory Visit: Payer: 59

## 2018-03-02 ENCOUNTER — Other Ambulatory Visit: Payer: 59

## 2020-06-10 ENCOUNTER — Other Ambulatory Visit: Payer: Self-pay | Admitting: Physician Assistant

## 2020-06-10 DIAGNOSIS — R0600 Dyspnea, unspecified: Secondary | ICD-10-CM

## 2020-06-10 DIAGNOSIS — M340 Progressive systemic sclerosis: Secondary | ICD-10-CM

## 2020-10-02 ENCOUNTER — Other Ambulatory Visit: Payer: Self-pay | Admitting: Physician Assistant

## 2020-10-02 DIAGNOSIS — R0600 Dyspnea, unspecified: Secondary | ICD-10-CM

## 2020-10-02 DIAGNOSIS — M340 Progressive systemic sclerosis: Secondary | ICD-10-CM

## 2020-10-21 ENCOUNTER — Other Ambulatory Visit: Payer: 59

## 2020-10-28 ENCOUNTER — Ambulatory Visit
Admission: RE | Admit: 2020-10-28 | Discharge: 2020-10-28 | Disposition: A | Discharge status: Home / Self Care | Payer: 59 | Source: Ambulatory Visit | Attending: Physician Assistant | Admitting: Physician Assistant

## 2020-10-28 DIAGNOSIS — M340 Progressive systemic sclerosis: Secondary | ICD-10-CM

## 2020-10-28 DIAGNOSIS — R0600 Dyspnea, unspecified: Secondary | ICD-10-CM

## 2020-11-02 ENCOUNTER — Other Ambulatory Visit: Payer: Self-pay

## 2020-11-02 ENCOUNTER — Emergency Department (HOSPITAL_COMMUNITY)
Admission: EM | Admit: 2020-11-02 | Discharge: 2020-11-02 | Disposition: A | Discharge status: Home / Self Care | Payer: 59 | Attending: Emergency Medicine | Admitting: Emergency Medicine

## 2020-11-02 DIAGNOSIS — F17210 Nicotine dependence, cigarettes, uncomplicated: Secondary | ICD-10-CM | POA: Diagnosis not present

## 2020-11-02 DIAGNOSIS — K068 Other specified disorders of gingiva and edentulous alveolar ridge: Secondary | ICD-10-CM | POA: Diagnosis present

## 2020-11-02 DIAGNOSIS — K0510 Chronic gingivitis, plaque induced: Secondary | ICD-10-CM

## 2020-11-02 MED ORDER — AMOXICILLIN-POT CLAVULANATE 875-125 MG PO TABS: 1.0000 | ORAL_TABLET | Freq: Two times a day (BID) | ORAL | 0 refills | Status: AC

## 2020-11-02 MED ORDER — LIDOCAINE VISCOUS HCL 2 % MT SOLN
15.0000 mL | Freq: Once | OROMUCOSAL | Status: AC
  Administered 2020-11-02: 15 mL via OROMUCOSAL
  Filled 2020-11-02: qty 15

## 2020-11-02 MED ORDER — NYSTATIN 100000 UNIT/ML MT SUSP: 5.0000 mL | Freq: Four times a day (QID) | OROMUCOSAL | 0 refills | Status: DC | PRN

## 2020-11-02 NOTE — Discharge Instructions (Addendum)
Take Augmentin twice daily for the next 7 days, make sure you take this with food.  To help with pain use Magic mouthwash up to 4 times daily as needed.  You can also use ibuprofen and Tylenol to help with pain.  Please follow-up closely with your dentist.

## 2020-11-02 NOTE — ED Provider Notes (Signed)
[facility] DEPT Provider Note   CSN: [id number] Arrival date & time: 11/02/20  1943     History Chief Complaint  Patient presents with   swollen gums   mouth pain    Connie Bond is a 55 y.o. female.  Connie Bond is a 55 y.o. female with history of scleroderma, ADHD, hyperlipidemia, TMJ, who presents to the emergency department for evaluation of pain and swelling of her lower gums.  She reports today she noticed pain at the base of her front lower gums where they meet her lip and noticed that there were white spots and that the skin looked very irritated.  This became increasingly painful throughout the day.  She called her dentist who recommended that she come to get evaluated for possible abscess.  She reports she noted some mild pain underneath her chin.  She has not seen any drainage from the area.  No injury or trauma to the area.  No fevers or chills.  The history is provided by the patient.      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   Mixed hyperlipidemia    TMJ (temporomandibular joint syndrome)     Patient Active Problem List   Diagnosis Date Noted   Arthralgia 04/02/2014   Nonallopathic lesion of lumbosacral region 04/02/2014   Nonallopathic lesion of sacral region 04/02/2014   Nonallopathic lesion of thoracic region 04/02/2014   Hypermobility syndrome 04/02/2014   Ache in joint 03/20/2014   Insomnia 01/11/2011   Allergic rhinitis, cause unspecified 01/11/2011   Infertility, female    Attention deficit disorder (ADD)    TMJ (temporomandibular joint syndrome)    Mixed hyperlipidemia     No past surgical history on file.   OB History   No obstetric history on file.     Family History  Problem Relation Age of Onset   Heart disease Father    Cancer Sister     Social History    Tobacco Use   Smoking status: Every Day    Packs/day: 0.20    Years: 28.00    Pack years: 5.60    Types: Cigarettes   Smokeless tobacco: Never  Substance Use Topics   Alcohol use: Yes    Alcohol/week: 0.0 standard drinks   Drug use: No    Types: Cocaine    Home Medications Prior to Admission medications   Medication Sig Start Date End Date Taking? Authorizing Provider  amoxicillin-clavulanate (AUGMENTIN) 875-125 MG tablet Take 1 tablet by mouth 2 (two) times daily. One po bid x 7 days 11/02/20  Yes Jacqlyn Larsen, PA-C  magic mouthwash (nystatin, lidocaine, diphenhydrAMINE, alum & mag hydroxide) suspension Swish and spit 5 mLs 4 (four) times daily as needed for mouth pain. 11/02/20  Yes Jacqlyn Larsen, PA-C  Ascorbic Acid (VITAMIN C) 1000 MG tablet Take 1,000 mg by mouth daily.    [provider]  Calcium-Magnesium-Zinc [phone] MG TABS Take 1 tablet by mouth daily.    [provider]  COLLAGEN PO Take 6,000 mg by mouth daily.    [provider]  cyclobenzaprine (FLEXERIL) 10 MG tablet Take 1 tablet (10 mg total) by mouth at bedtime. 12/11/14   Darlyne Russian, MD  desvenlafaxine (PRISTIQ) 100 MG 24 hr tablet Take 100 mg by mouth daily.  [provider]  methylphenidate 18 MG PO CR tablet Take 18 mg by mouth daily.    [provider]  MILK THISTLE PO Take 400 mg by mouth daily.    [provider]  Misc Natural Products (TURMERIC CURCUMIN) CAPS Take 1 capsule by mouth daily.    [provider]  Multiple Vitamin (MULTIVITAMIN WITH MINERALS) TABS tablet Take 1 tablet by mouth daily.    [provider]  pantoprazole (PROTONIX) 20 MG tablet TAKE 1 TABLET (20 MG TOTAL) BY MOUTH DAILY. 12/08/16   Mauri Pole, MD  Probiotic Product (PROBIOTIC ADVANCED) CAPS Take 1 capsule by mouth daily.    [provider]    Allergies    Patient has no known allergies.  Review of Systems   Review of Systems   Constitutional:  Negative for chills and fever.  HENT:  Positive for dental problem and mouth sores. Negative for trouble swallowing.   All other systems reviewed and are negative.  Physical Exam Updated Vital Signs BP (!) 154/114 (BP Location: Left Arm)   Pulse 98   Temp 98.3 F (36.8 C) (Oral)   Resp 15   Ht '5\' 11"'$  (1.803 m)   Wt 61.7 kg   SpO2 100%   BMI 18.97 kg/m   Physical Exam Vitals and nursing note reviewed.  Constitutional:      General: She is not in acute distress.    Appearance: Normal appearance. She is well-developed and normal weight. She is not ill-appearing or diaphoretic.  HENT:     Head: Normocephalic and atraumatic.     Mouth/Throat:     Comments: Erythema and irritation noted at the base of the anterior lower gums as noted in photo below.  No palpable abscess or expressible drainage.  No tenderness or induration beneath the tongue.  Posterior oropharynx clear, normal phonation, tolerating secretions.  No stridor, no tenderness or fullness palpable under the chin. Eyes:     General:        Right eye: No discharge.        Left eye: No discharge.  Pulmonary:     Effort: Pulmonary effort is normal. No respiratory distress.  Skin:    General: Skin is warm and dry.  Neurological:     Mental Status: She is alert and oriented to person, place, and time.     Coordination: Coordination normal.  Psychiatric:        Mood and Affect: Mood normal.        Behavior: Behavior normal.     ED Results / Procedures / Treatments   Labs (all labs ordered are listed, but only abnormal results are displayed) Labs Reviewed - No data to display  EKG None  Radiology No results found.  Procedures Procedures   Medications Ordered in ED Medications  lidocaine (XYLOCAINE) 2 % viscous mouth solution 15 mL (15 mLs Mouth/Throat Given 11/02/20 2116)    ED Course  I have reviewed the triage vital signs and the nursing notes.  Pertinent labs & imaging results that  were available during my care of the patient were reviewed by me and considered in my medical decision making (see chart for details).    MDM Rules/Calculators/A&P                           Gum irritation as pictured above without palpable abscess, no concern for Ludwick's angina.  Vitals normal, patient able to swallow without difficulty.  Viscous lidocaine given in the ED.  Will treat with Augmentin and Magic mouthwash and have patient follow closely with her dentist.  Return precautions discussed.  Discharged home in good condition.  Final Clinical Impression(s) / ED Diagnoses Final diagnoses:  Gum inflammation    Rx / DC Orders ED Discharge Orders          Ordered    amoxicillin-clavulanate (AUGMENTIN) 875-125 MG tablet  2 times daily        11/02/20 2102    magic mouthwash (nystatin, lidocaine, diphenhydrAMINE, alum & mag hydroxide) suspension  4 times daily PRN       Note to Pharmacy: 1:1:1:1 ratio   11/02/20 2102             Jacqlyn Larsen, PA-C 11/02/20 2206    Valarie Merino, MD 11/04/20 1454

## 2020-11-02 NOTE — ED Triage Notes (Signed)
Pt complains of mouth pain and gum swelling/pain x 1 day.

## 2020-11-03 ENCOUNTER — Telehealth (HOSPITAL_COMMUNITY): Payer: Self-pay | Admitting: Emergency Medicine

## 2020-11-03 MED ORDER — NYSTATIN 100000 UNIT/ML MT SUSP: 5.0000 mL | Freq: Four times a day (QID) | OROMUCOSAL | 0 refills | Status: AC | PRN

## 2020-11-03 NOTE — Telephone Encounter (Signed)
Received a phone call from the patient's pharmacy unable to fill Magic mouthwash for her recommending we send the prescription to [facility].

## 2020-11-10 ENCOUNTER — Telehealth: Payer: Self-pay | Admitting: Emergency Medicine

## 2020-11-10 NOTE — Telephone Encounter (Signed)
Hello -  Need to get this patient set up with me for consult.  Dx > calcified pulmonary pulmonary nodules.   OK to use a nodule slot. Thanks.

## 2020-11-11 NOTE — Telephone Encounter (Signed)
ATC voicemail full. If pt calls office back please schedule for consult with Dr. Lamonte Sakai in first available nodule slot.

## 2020-11-20 NOTE — Telephone Encounter (Signed)
Appointment scheduled. Nothing further needed at this time.

## 2020-11-25 ENCOUNTER — Other Ambulatory Visit: Payer: Self-pay

## 2020-11-25 ENCOUNTER — Institutional Professional Consult (permissible substitution): Payer: 59 | Admitting: Emergency Medicine

## 2020-11-25 ENCOUNTER — Ambulatory Visit: Payer: 59 | Admitting: Emergency Medicine

## 2020-11-25 ENCOUNTER — Encounter: Payer: Self-pay | Admitting: Emergency Medicine

## 2020-11-25 VITALS — BP 118/74 | HR 70 | Temp 98.2°F | Ht 70.0 in | Wt 139.4 lb

## 2020-11-25 DIAGNOSIS — R9389 Abnormal findings on diagnostic imaging of other specified body structures: Secondary | ICD-10-CM | POA: Diagnosis not present

## 2020-11-25 DIAGNOSIS — R590 Localized enlarged lymph nodes: Secondary | ICD-10-CM | POA: Diagnosis not present

## 2020-11-25 DIAGNOSIS — R0602 Shortness of breath: Secondary | ICD-10-CM

## 2020-11-25 DIAGNOSIS — R0600 Dyspnea, unspecified: Secondary | ICD-10-CM | POA: Insufficient documentation

## 2020-11-25 LAB — CBC WITH DIFFERENTIAL/PLATELET
Basophils Absolute: 0 10*3/uL (ref 0.0–0.1)
Basophils Relative: 0.8 % (ref 0.0–3.0)
Eosinophils Absolute: 0.3 10*3/uL (ref 0.0–0.7)
Eosinophils Relative: 6 % — ABNORMAL HIGH (ref 0.0–5.0)
HCT: 36.2 % (ref 36.0–46.0)
Hemoglobin: 12 g/dL (ref 12.0–15.0)
Lymphocytes Relative: 26.7 % (ref 12.0–46.0)
Lymphs Abs: 1.3 10*3/uL (ref 0.7–4.0)
MCHC: 33.2 g/dL (ref 30.0–36.0)
MCV: 96 fl (ref 78.0–100.0)
Monocytes Absolute: 0.5 10*3/uL (ref 0.1–1.0)
Monocytes Relative: 9.7 % (ref 3.0–12.0)
Neutro Abs: 2.9 10*3/uL (ref 1.4–7.7)
Neutrophils Relative %: 56.8 % (ref 43.0–77.0)
Platelets: 265 10*3/uL (ref 150.0–400.0)
RBC: 3.78 Mil/uL — ABNORMAL LOW (ref 3.87–5.11)
RDW: 12.4 % (ref 11.5–15.5)
WBC: 5 10*3/uL (ref 4.0–10.5)

## 2020-11-25 NOTE — Assessment & Plan Note (Signed)
Atypical symptoms, not associated with exertion or cough/wheeze.  Happens intermittently and feels like inability to take a deep breath or possible airflow obstruction, question upper airway in nature.  I think it would be reasonable for [place] to perform pulmonary function testing to rule out occult obstructive disease and she agrees.  Because she has a history of an eosinophilic phenotype, has had work-up in the past for possible mast cell activation syndrome (negative) and topical dermatitis/urticaria, we will check an IgE, CBC with differential to look for eosinophilia.

## 2020-11-25 NOTE — Progress Notes (Signed)
Subjective:    Patient ID: Connie Bond, female    DOB: 1966-03-03, 55 y.o.   MRN: [id number]  HPI 55 year old woman with a history of tobacco use (6-10 pack years), allergic rhinitis, ADD, prior substance abuse, hyperlipidemia, some sort of rheumatological disease that has been thought to be consistent with scleroderma, although never definitively established.  She has OSA and is on CPAP since 1 yr ago.  She carries a dx of atopic dermatitis, has been evaluated for mast cell activation syndrome in the past but felt that her symptoms were overall inconsistent without any other organ involvement (including pulmonary evaluation 2018). She has apparently had elevated histamine and eosinophil levels.   She has had skin changes - itchy, papular w some scale and serous fluid oozing, Reynauld's phenomenon for many yrs. She has joint disease - arthralgias without swelling.  She has new episodic SOB, can happen at rest. Never has exertional dyspnea. These episodes are associated with some palpitations.   CT chest 10/28/2020 reviewed by me, shows some scattered calcified granulomatous, no evidence of active autoimmune disease or sequela.  Specifically no groundglass, architectural distortion or honeycomb change.  No evidence for ILD.  There may be some heterogeneity that would suggest possible air trapping.   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   Mixed hyperlipidemia    TMJ (temporomandibular joint syndrome)      Family History  Problem Relation Age of Onset   Heart disease Father    Cancer Sister      Social History   Socioeconomic History   Marital status: Married    Spouse name: Not on file   Number of children: 1   Years of education: Not on file   Highest education level: Not on file  Occupational History    Occupation: Homemaker  Tobacco Use   Smoking status: Every Day    Packs/day: 0.20    Years: 28.00    Pack years: 5.60    Types: Cigarettes   Smokeless tobacco: Never   Tobacco comments:    Currently smokes every other day 1 - 2 cigarettes after dinner ARJ, RN   Substance and Sexual Activity   Alcohol use: Yes    Alcohol/week: 0.0 standard drinks   Drug use: No    Types: Cocaine   Sexual activity: Not on file  Other Topics Concern   Not on file  Social History Narrative   Not on file   Social Determinants of Health   Financial Resource Strain: Not on file  Food Insecurity: Not on file  Transportation Needs: Not on file  Physical Activity: Not on file  Stress: Not on file  Social Connections: Not on file  Intimate Partner Violence: Not on file    Has lived in [place], [place], [place] Worked in furniture business  No Known Allergies   Outpatient Medications Prior to Visit  Medication Sig Dispense Refill   Ascorbic Acid (VITAMIN C) 1000 MG tablet Take 1,000 mg by mouth daily.     Calcium-Magnesium-Zinc 167-83-8 MG TABS Take 1 tablet by mouth daily.     cyclobenzaprine (FLEXERIL) 10 MG tablet Take 1 tablet (10 mg total) by mouth at bedtime. 30 tablet 2   Misc Natural Products (TURMERIC CURCUMIN) CAPS Take 1 capsule by mouth  daily.     Multiple Vitamin (MULTIVITAMIN WITH MINERALS) TABS tablet Take 1 tablet by mouth daily.     Probiotic Product (PROBIOTIC ADVANCED) CAPS Take 1 capsule by mouth daily.     amoxicillin-clavulanate (AUGMENTIN) 875-125 MG tablet Take 1 tablet by mouth 2 (two) times daily. One po bid x 7 days 14 tablet 0   COLLAGEN PO Take 6,000 mg by mouth daily. (Patient not taking: Reported on 11/25/2020)     desvenlafaxine (PRISTIQ) 100 MG 24 hr tablet Take 100 mg by mouth daily. (Patient not taking: Reported on 11/25/2020)     magic mouthwash (nystatin, lidocaine, diphenhydrAMINE, alum & mag hydroxide) suspension Swish and spit 5 mLs 4 (four) times daily as needed for mouth  pain. (Patient not taking: Reported on 11/25/2020) 180 mL 0   methylphenidate 18 MG PO CR tablet Take 18 mg by mouth daily. (Patient not taking: Reported on 11/25/2020)     MILK THISTLE PO Take 400 mg by mouth daily. (Patient not taking: Reported on 11/25/2020)     pantoprazole (PROTONIX) 20 MG tablet TAKE 1 TABLET (20 MG TOTAL) BY MOUTH DAILY. (Patient not taking: Reported on 11/25/2020) 30 tablet 6   No facility-administered medications prior to visit.          Objective:   Physical Exam  Vitals:   11/25/20 1058  BP: 118/74  Pulse: 70  Temp: 98.2 F (36.8 C)  TempSrc: Oral  SpO2: 99%  Weight: 139 lb 6.4 oz (63.2 kg)  Height: '5\' 10"'$  (1.778 m)   Gen: Pleasant, well-nourished, in no distress,  normal affect  ENT: No lesions,  mouth clear,  oropharynx clear, no postnasal drip  Neck: No JVD, no stridor  Lungs: No use of accessory muscles, no crackles or wheezing on normal respiration, no wheeze on forced expiration  Cardiovascular: RRR, heart sounds normal, no murmur or gallops, no peripheral edema  Musculoskeletal: No deformities, no cyanosis or clubbing  Neuro: alert, awake, non focal  Skin: Warm, no lesions or rash     Assessment & Plan:   Dyspnea Atypical symptoms, not associated with exertion or cough/wheeze.  Happens intermittently and feels like inability to take a deep breath or possible airflow obstruction, question upper airway in nature.  I think it would be reasonable for [place] to perform pulmonary function testing to rule out occult obstructive disease and she agrees.  Because she has a history of an eosinophilic phenotype, has had work-up in the past for possible mast cell activation syndrome (negative) and topical dermatitis/urticaria, we will check an IgE, CBC with differential to look for eosinophilia.  Abnormal CT of the chest She has bilateral calcified pulmonary nodular disease as well as some calcified mediastinal lymphadenopathy without any evidence for  fibrosing mediastinitis.  Unclear etiology but suspect granulomatous, probably histoplasma exposure or other fungal exposure.  No evidence of active disease.  Reassured her about this.  There is no ILD that would correlate with the suspicion that she may have autoimmune disease-good news.  Discussed this with her today.  Plan to check an angiotensin-converting enzyme level, hypersensitivity pneumonitis panel for completeness.  Time spent 60 minutes  Baltazar Apo, MD, PhD 11/25/2020, 2:00 PM [facility] [phone] or if no answer before 7:00PM call [phone] For any issues after 7:00PM please call eLink [phone]

## 2020-11-25 NOTE — Patient Instructions (Signed)
Lab work today We will perform pulmonary function testing in next office visit We will obtain your lab results from [place] MD, Dorian Furnace PA-C Follow with Dr. Lamonte Sakai next available with full pulmonary function testing on the same day.

## 2020-11-25 NOTE — Assessment & Plan Note (Addendum)
She has bilateral calcified pulmonary nodular disease as well as some calcified mediastinal lymphadenopathy without any evidence for fibrosing mediastinitis.  Unclear etiology but suspect granulomatous, probably histoplasma exposure or other fungal exposure.  No evidence of active disease.  Reassured her about this.  There is no ILD that would correlate with the suspicion that she may have autoimmune disease-good news.  Discussed this with her today.  Plan to check an angiotensin-converting enzyme level, hypersensitivity pneumonitis panel for completeness.

## 2020-11-27 LAB — IGE: IgE (Immunoglobulin E), Serum: 264 kU/L — ABNORMAL HIGH (ref <=114)

## 2020-11-27 LAB — ANGIOTENSIN CONVERTING ENZYME: Angiotensin-Converting Enzyme: 35 U/L (ref 9–67)

## 2020-11-30 LAB — HYPERSENSITIVITY PNEUMONITIS
A. Pullulans Abs: NEGATIVE
A.Fumigatus #1 Abs: NEGATIVE
Micropolyspora faeni, IgG: NEGATIVE
Pigeon Serum Abs: NEGATIVE
Thermoact. Saccharii: NEGATIVE
Thermoactinomyces vulgaris, IgG: NEGATIVE

## 2021-01-12 ENCOUNTER — Ambulatory Visit: Payer: 59 | Admitting: Emergency Medicine

## 2021-01-12 ENCOUNTER — Ambulatory Visit (INDEPENDENT_AMBULATORY_CARE_PROVIDER_SITE_OTHER): Payer: 59 | Admitting: Emergency Medicine

## 2021-01-12 ENCOUNTER — Other Ambulatory Visit: Payer: Self-pay

## 2021-01-12 DIAGNOSIS — R590 Localized enlarged lymph nodes: Secondary | ICD-10-CM | POA: Diagnosis not present

## 2021-01-12 LAB — PULMONARY FUNCTION TEST
DL/VA % pred: 79 %
DL/VA: 3.26 ml/min/mmHg/L
DLCO cor % pred: 86 %
DLCO cor: 21.57 ml/min/mmHg
DLCO unc % pred: 86 %
DLCO unc: 21.57 ml/min/mmHg
FEF 25-75 Post: 3.01 L/sec
FEF 25-75 Pre: 2.86 L/sec
FEF2575-%Change-Post: 5 %
FEF2575-%Pred-Post: 102 %
FEF2575-%Pred-Pre: 97 %
FEV1-%Change-Post: 2 %
FEV1-%Pred-Post: 113 %
FEV1-%Pred-Pre: 111 %
FEV1-Post: 3.73 L
FEV1-Pre: 3.65 L
FEV1FVC-%Change-Post: 3 %
FEV1FVC-%Pred-Pre: 92 %
FEV6-%Change-Post: 0 %
FEV6-%Pred-Post: 121 %
FEV6-%Pred-Pre: 122 %
FEV6-Post: 4.95 L
FEV6-Pre: 5 L
FEV6FVC-%Change-Post: 0 %
FEV6FVC-%Pred-Post: 103 %
FEV6FVC-%Pred-Pre: 102 %
FVC-%Change-Post: -1 %
FVC-%Pred-Post: 117 %
FVC-%Pred-Pre: 119 %
FVC-Post: 4.95 L
FVC-Pre: 5.01 L
Post FEV1/FVC ratio: 75 %
Post FEV6/FVC ratio: 100 %
Pre FEV1/FVC ratio: 73 %
Pre FEV6/FVC Ratio: 100 %
RV % pred: 129 %
RV: 2.83 L
TLC % pred: 115 %
TLC: 6.9 L

## 2021-01-12 NOTE — Patient Instructions (Signed)
Full PFT performed today. °

## 2021-01-12 NOTE — Progress Notes (Signed)
Full PFT performed today. °

## 2021-01-14 ENCOUNTER — Other Ambulatory Visit: Payer: Self-pay

## 2021-01-14 ENCOUNTER — Ambulatory Visit (INDEPENDENT_AMBULATORY_CARE_PROVIDER_SITE_OTHER): Payer: 59 | Admitting: Emergency Medicine

## 2021-01-14 ENCOUNTER — Encounter: Payer: Self-pay | Admitting: Emergency Medicine

## 2021-01-14 DIAGNOSIS — R0602 Shortness of breath: Secondary | ICD-10-CM

## 2021-01-14 DIAGNOSIS — R9389 Abnormal findings on diagnostic imaging of other specified body structures: Secondary | ICD-10-CM

## 2021-01-14 NOTE — Patient Instructions (Addendum)
We reviewed your PFT and lab work today.   Try using loratadine 10 mg once daily (generic Claritin) to see if this helps with your congestion You could also try fluticasone nasal spray, 2 sprays each nostril once daily (generic Flonase) Follow Dr. Lamonte Sakai in 1 year or sooner if you have any problems.

## 2021-01-14 NOTE — Progress Notes (Signed)
Subjective:    Patient ID: Connie Bond, female    DOB: 08-16-1965, 55 y.o.   MRN: [id number]  HPI 55 year old woman with a history of tobacco use (6-10 pack years), allergic rhinitis, ADD, prior substance abuse, hyperlipidemia, some sort of rheumatological disease that has been thought to be consistent with scleroderma, although never definitively established.  She has OSA and is on CPAP since 1 yr ago.  She carries a dx of atopic dermatitis, has been evaluated for mast cell activation syndrome in the past but felt that her symptoms were overall inconsistent without any other organ involvement (including pulmonary evaluation 2018). She has apparently had elevated histamine and eosinophil levels.   She has had skin changes - itchy, papular w some scale and serous fluid oozing, Reynauld's phenomenon for many yrs. She has joint disease - arthralgias without swelling.  She has new episodic SOB, can happen at rest. Never has exertional dyspnea. These episodes are associated with some palpitations.    CT chest 10/28/2020 reviewed by me, shows some scattered calcified granulomatous, no evidence of active autoimmune disease or sequela.  Specifically no groundglass, architectural distortion or honeycomb change.  No evidence for ILD.  There may be some heterogeneity that would suggest possible air trapping.  ROV 01/14/21 --follow-up visit 55 year old woman with a history of minimal tobacco use, ADD, hyperlipidemia, possible autoimmune disease, question scleroderma (question final diagnosis).  Has been evaluated for possible mast cell activation syndrome, unclear.  She has OSA on CPAP.  I saw her 1 month ago for shortness of breath, question upper airway in nature.  She also had a CT chest with bilateral calcified pulmonary nodular disease consistent with possible prior granulomatous disease, histoplasmosis without any evidence of fibrosing mediastinitis or interstitial lung disease.  Labs  11/25/2020: Hypersensitivity pneumonitis panel negative IgE 264 CBC with relative eosinophil count 6%, absolute count normal 0.3 ACE level 35  Pulmonary function testing performed 01/12/2021 and reviewed by me, show normal airflows without a bronchodilator response, normal lung volumes, normal diffusion capacity.   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   Mixed hyperlipidemia    TMJ (temporomandibular joint syndrome)      Family History  Problem Relation Age of Onset   Heart disease Father    Cancer Sister      Social History   Socioeconomic History   Marital status: Married    Spouse name: Not on file   Number of children: 1   Years of education: Not on file   Highest education level: Not on file  Occupational History   Occupation: Homemaker  Tobacco Use   Smoking status: Every Day    Packs/day: 0.20    Years: 28.00    Pack years: 5.60    Types: Cigarettes   Smokeless tobacco: Never   Tobacco comments:    Currently smokes every other day 1 - 2 cigarettes after dinner ARJ, RN   Substance and Sexual Activity   Alcohol use: Yes    Alcohol/week: 0.0 standard drinks   Drug use: No    Types: Cocaine   Sexual activity: Not on file  Other Topics Concern   Not on file  Social History Narrative   Not on file   Social Determinants of Health   Financial Resource  Strain: Not on file  Food Insecurity: Not on file  Transportation Needs: Not on file  Physical Activity: Not on file  Stress: Not on file  Social Connections: Not on file  Intimate Partner Violence: Not on file    Has lived in [place], [place], [place]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 30 tablet 2   Misc Natural Products (TURMERIC CURCUMIN) CAPS Take 1 capsule by mouth daily.     Multiple Vitamin (MULTIVITAMIN WITH MINERALS) TABS tablet Take 1 tablet by mouth daily.     Probiotic Product (PROBIOTIC ADVANCED) CAPS Take 1 capsule by mouth daily.     vortioxetine HBr (TRINTELLIX) 20 MG TABS tablet Trintellix 20 mg tablet  TAKE 1 TABLET BY MOUTH EVERY DAY AS DIRECTED     amoxicillin-clavulanate (AUGMENTIN) 875-125 MG tablet Take 1 tablet by mouth 2 (two) times daily. One po bid x 7 days (Patient not taking: Reported on 01/14/2021) 14 tablet 0   Ascorbic Acid (VITAMIN C) 1000 MG tablet Take 1,000 mg by mouth daily.     Calcium-Magnesium-Zinc 167-83-8 MG TABS Take 1 tablet by mouth daily.     COLLAGEN PO Take 6,000 mg by mouth daily. (Patient not taking: No sig reported)     desvenlafaxine (PRISTIQ) 100 MG 24 hr tablet Take 100 mg by mouth daily. (Patient not taking: No sig reported)     magic mouthwash (nystatin, lidocaine, diphenhydrAMINE, alum & mag hydroxide) suspension Swish and spit 5 mLs 4 (four) times daily as needed for mouth pain. (Patient not taking: Reported on 01/14/2021) 180 mL 0   methylphenidate 18 MG PO CR tablet Take 18 mg by mouth daily. (Patient not taking: No sig reported)     MILK THISTLE PO Take 400 mg by mouth daily. (Patient not taking: No sig reported)     pantoprazole (PROTONIX) 20 MG tablet TAKE 1 TABLET (20 MG TOTAL) BY MOUTH DAILY. (Patient not taking: No sig reported) 30 tablet 6   No facility-administered medications prior to visit.          Objective:   Physical Exam  Vitals:   01/14/21 1140  BP: (!) 112/54  Pulse: 93  Temp: 98.6 F (37 C)  TempSrc: Oral  SpO2: 98%  Weight: 136 lb (61.7 kg)  Height: 5\' 10"  (1.778 m)   Gen: Pleasant, well-nourished, in no distress,  normal affect  ENT: No lesions,  mouth clear,  oropharynx clear, no postnasal drip  Neck: No JVD, no stridor  Lungs: No use of accessory muscles, no  crackles or wheezing on normal respiration, no wheeze on forced expiration  Cardiovascular: RRR, heart sounds normal, no murmur or gallops, no peripheral edema  Musculoskeletal: No deformities, no cyanosis or clubbing  Neuro: alert, awake, non focal  Skin: Warm, no lesions or rash     Assessment & Plan:   Abnormal CT of the chest CT chest with scattered calcified granulomatous disease, no evidence of ILD or active pulmonary nodular disease.  She has calcified mediastinal lymphadenopathy as well without any evidence of fibrosing mediastinitis.  Her HSP panel, ACE level were both negative.  She does have an eosinophilia, nonspecific finding.  She was undergoing an autoimmune work-up but so far this is been unrevealing.  Not clear to me that the CT findings would correlate with an autoimmune process.  For now I do not think we need to schedule any dedicated CT follow-up.  We could discuss going forward depending on how symptoms evolve.  Dyspnea Pulmonary function testing today is reassuring.  No evidence of airflow obstruction.  She does have some nasal obstruction on exam and complains of having difficulty breathing through her nose.  Plan to add nasal steroid, loratadine.  Hold off on bronchodilator therapy for now.  Time spent 34 minutes  Baltazar Apo, MD, PhD 01/14/2021, 5:32 PM [facility] [phone] or if no answer before 7:00PM call [phone] For any issues after 7:00PM please call eLink [phone]

## 2021-01-14 NOTE — Assessment & Plan Note (Signed)
Pulmonary function testing today is reassuring.  No evidence of airflow obstruction.  She does have some nasal obstruction on exam and complains of having difficulty breathing through her nose.  Plan to add nasal steroid, loratadine.  Hold off on bronchodilator therapy for now.

## 2021-01-14 NOTE — Assessment & Plan Note (Signed)
CT chest with scattered calcified granulomatous disease, no evidence of ILD or active pulmonary nodular disease.  She has calcified mediastinal lymphadenopathy as well without any evidence of fibrosing mediastinitis.  Her HSP panel, ACE level were both negative.  She does have an eosinophilia, nonspecific finding.  She was undergoing an autoimmune work-up but so far this is been unrevealing.  Not clear to me that the CT findings would correlate with an autoimmune process.  For now I do not think we need to schedule any dedicated CT follow-up.  We could discuss going forward depending on how symptoms evolve.

## 2022-02-15 DIAGNOSIS — F10129 Alcohol abuse with intoxication, unspecified: Secondary | ICD-10-CM | POA: Diagnosis not present

## 2022-02-15 DIAGNOSIS — R45851 Suicidal ideations: Secondary | ICD-10-CM | POA: Insufficient documentation

## 2022-02-15 DIAGNOSIS — F4381 Prolonged grief disorder: Secondary | ICD-10-CM | POA: Diagnosis not present

## 2022-02-15 DIAGNOSIS — F99 Mental disorder, not otherwise specified: Secondary | ICD-10-CM | POA: Diagnosis not present

## 2022-02-15 DIAGNOSIS — F329 Major depressive disorder, single episode, unspecified: Secondary | ICD-10-CM | POA: Diagnosis not present

## 2022-02-15 DIAGNOSIS — Y908 Blood alcohol level of 240 mg/100 ml or more: Secondary | ICD-10-CM | POA: Insufficient documentation

## 2022-02-15 DIAGNOSIS — F4323 Adjustment disorder with mixed anxiety and depressed mood: Secondary | ICD-10-CM | POA: Diagnosis present

## 2022-02-16 ENCOUNTER — Emergency Department (HOSPITAL_COMMUNITY)
Admission: EM | Admit: 2022-02-16 | Discharge: 2022-02-16 | Disposition: A | Discharge status: Home / Self Care | Payer: 59 | Attending: Emergency Medicine | Admitting: Emergency Medicine

## 2022-02-16 ENCOUNTER — Other Ambulatory Visit: Payer: Self-pay

## 2022-02-16 ENCOUNTER — Encounter (HOSPITAL_COMMUNITY): Payer: Self-pay

## 2022-02-16 DIAGNOSIS — F4329 Adjustment disorder with other symptoms: Secondary | ICD-10-CM | POA: Insufficient documentation

## 2022-02-16 DIAGNOSIS — R45851 Suicidal ideations: Secondary | ICD-10-CM

## 2022-02-16 DIAGNOSIS — F10929 Alcohol use, unspecified with intoxication, unspecified: Secondary | ICD-10-CM

## 2022-02-16 DIAGNOSIS — F4323 Adjustment disorder with mixed anxiety and depressed mood: Secondary | ICD-10-CM

## 2022-02-16 LAB — CBC WITH DIFFERENTIAL/PLATELET
Abs Immature Granulocytes: 0.01 10*3/uL (ref 0.00–0.07)
Basophils Absolute: 0.1 10*3/uL (ref 0.0–0.1)
Basophils Relative: 2 %
Eosinophils Absolute: 0.3 10*3/uL (ref 0.0–0.5)
Eosinophils Relative: 4 %
HCT: 34.3 % — ABNORMAL LOW (ref 36.0–46.0)
Hemoglobin: 12.2 g/dL (ref 12.0–15.0)
Immature Granulocytes: 0 %
Lymphocytes Relative: 37 %
Lymphs Abs: 2.8 10*3/uL (ref 0.7–4.0)
MCH: 32.6 pg (ref 26.0–34.0)
MCHC: 35.6 g/dL (ref 30.0–36.0)
MCV: 91.7 fL (ref 80.0–100.0)
Monocytes Absolute: 0.8 10*3/uL (ref 0.1–1.0)
Monocytes Relative: 10 %
Neutro Abs: 3.5 10*3/uL (ref 1.7–7.7)
Neutrophils Relative %: 47 %
Platelets: 256 10*3/uL (ref 150–400)
RBC: 3.74 MIL/uL — ABNORMAL LOW (ref 3.87–5.11)
RDW: 11.9 % (ref 11.5–15.5)
WBC: 7.5 10*3/uL (ref 4.0–10.5)
nRBC: 0 % (ref 0.0–0.2)

## 2022-02-16 LAB — COMPREHENSIVE METABOLIC PANEL
ALT: 24 U/L (ref 0–44)
AST: 37 U/L (ref 15–41)
Albumin: 3.9 g/dL (ref 3.5–5.0)
Alkaline Phosphatase: 71 U/L (ref 38–126)
Anion gap: 17 — ABNORMAL HIGH (ref 5–15)
BUN: 9 mg/dL (ref 6–20)
CO2: 22 mmol/L (ref 22–32)
Calcium: 9.2 mg/dL (ref 8.9–10.3)
Chloride: 89 mmol/L — ABNORMAL LOW (ref 98–111)
Creatinine, Ser: 0.87 mg/dL (ref 0.44–1.00)
GFR, Estimated: >60 mL/min (ref >60)
Glucose, Bld: 114 mg/dL — ABNORMAL HIGH (ref 70–99)
Potassium: 4.2 mmol/L (ref 3.5–5.1)
Sodium: 128 mmol/L — ABNORMAL LOW (ref 135–145)
Total Bilirubin: 0.6 mg/dL (ref 0.3–1.2)
Total Protein: 6.8 g/dL (ref 6.5–8.1)

## 2022-02-16 LAB — SALICYLATE LEVEL: Salicylate Lvl: <7 mg/dL — ABNORMAL LOW (ref 7.0–30.0)

## 2022-02-16 LAB — ETHANOL: Alcohol, Ethyl (B): 322 mg/dL (ref <10)

## 2022-02-16 LAB — ACETAMINOPHEN LEVEL: Acetaminophen (Tylenol), Serum: <10 ug/mL — ABNORMAL LOW (ref 10–30)

## 2022-02-16 MED ORDER — LACTATED RINGERS IV BOLUS: 2000.0000 mL | Freq: Once | INTRAVENOUS | Status: DC

## 2022-02-16 NOTE — Consult Note (Signed)
[facility] ED ASSESSMENT   Reason for Consult:  Psychiatric Consult  Referring Physician:   Patient Identification: Connie Bond MRN:  [id number] ED Chief Complaint: Adjustment disorder with mixed anxiety and depressed mood  Diagnosis:  Principal Problem:   Adjustment disorder with mixed anxiety and depressed mood Active Problems:   Prolonged grief reaction   ED Assessment Time Calculation: Start Time: 0115 Stop Time: 0145 Total Time in Minutes (Assessment Completion): 30   Subjective:   Connie Bond is a 56 y.o. female patient presented to [facility], voluntarily, accompanied by husband with due alcohol intoxication and mental health evaluation after a verbal altercation with her husband.  Patient's husband is at bedside, for clarification, patient did not attempt suicide and was not suicidal on arrival here at the ED. Patient took 2 Xanax with several glasses of wine last night after being prescribed only a few, she thinks maybe 6 to help with anxiety and ongoing insomnia. This Probation officer agreed to clarify this information within her medical record.    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 Patient provided permission for husband to remain present during a portion of the evaluation.   Connie Bond reports last night having a verbal altercation with her husband. She reports an "emotional build-up" due to husband has had several affairs, the death of her father two years ago, and financial ordeal involving her sister. Connie Bond endorses last night, she felt like everything she has been carry "inside" came out and she is unable to continue function in her current state and recognizes that she needs mental health and to appropriately grieve for her father. She  admits using alcohol as a means to cope with her sadness and sense of loss since her father died. Patient denies any formal diagnosis of depression, however reports that in college she had a mental health crisis and the psychiatrist diagnosed her as having a "chemical imbalance.  She denies any prior attempts of suicide.  Denies any homicidal ideations.  Endorses due to stress had not been sleeping well over the past 3 days.  She reports that was the reason she had requested a prescription for Xanax to help her sleep.  Reports that she only had about 6 or maybe 8 initially prescribed to her and she only took 2 however was unaware of the adverse reaction 1 can experience with mixing alcohol and benzodiazepines.   During evaluation Connie Bond is sitting on the stretcher in no acute distress.  She is alert, oriented x 4, anxious, cooperative and attentive.  Patient is very apologetic throughout evaluation today.Her mood is anxious and depressed with congruent affect.  She has normal speech, and behavior.  Objectively there is no evidence of psychosis/mania or delusional thinking.  Patient is able to converse coherently, goal directed thoughts, no distractibility, or pre-occupation.  She also denies suicidal/self-harm/homicidal ideation, psychosis, and paranoia.  Patient answered question appropriately.     Patient is able to contract for safety however is [facility]  Husband endorses that he feels safe with taking her to the facility.  Patient is also able to contract for safety.   Collateral information from Husband:  Connie Bond over the last 8 months has steadily increased the volume and frequency  in her alcohol consumption.  According to husband, patient has drinks socially, however has not previously had a dependence on alcohol.  Husband endorses several stressors including marital issues and the death of her father 2 years ago  which she has failed to grieve recently.  Husband states since she was brought to the ED overnight he has contacted a friend and patient has a in patient psychiatric bed waiting at facility which treats dual disorders, mental health and alcohol. Patient provided this Probation officer with contact information for the owner of the facility, Mr. Barbaraann Rondo, he confirmed patient has an inpatient psychiatric bed waiting at [facility] in [place].      Past Psychiatric History:  No prior inpatient psychiatric admissions. Per patient diagnosed with adult ADHD prescribed Wellbutrin and Lexapro    Risk to Self or Others: Is the patient at risk to self? Yes, do hazard behaviors (drinking alcohol and taking Xanax), however patient is adamant that she is not suicidal.  Has the patient been a risk to self in the past 6 months? No Has the patient been a risk to self within the distant past? No Is the patient a risk to others? No Has the patient been a risk to others in the past 6 months? No Has the patient been a risk to others within the distant past? 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 No pertinent surgical history. Family History:  Family History  Problem Relation Age of Onset   Heart disease Father    Cancer Sister    Social History:  Social History   Substance and Sexual Activity  Alcohol Use Yes   Alcohol/week: 0.0 standard drinks of alcohol     Social History    Substance and Sexual Activity  Drug Use No   Types: Cocaine    Social History   Socioeconomic History   Marital status: Married    Spouse name: Not on file   Number of children: 1   Years of education: Not on file   Highest education level: Not on file  Occupational History   Occupation: Homemaker  Tobacco Use   Smoking status: Every Day    Packs/day: 0.20    Years: 28.00    Total pack years: 5.60    Types: Cigarettes   Smokeless tobacco: Never   Tobacco comments:    Currently smokes every other day 1 - 2 cigarettes after dinner ARJ, RN   Substance and Sexual Activity   Alcohol use: Yes    Alcohol/week: 0.0 standard drinks of alcohol   Drug use: No    Types: Cocaine   Sexual activity: Not on file  Other Topics Concern   Not on file  Social History Narrative   Not on file   Social Determinants of Health   Financial Resource Strain: Not on file  Food Insecurity: Not  on file  Transportation Needs: Not on file  Physical Activity: Not on file  Stress: Not on file  Social Connections: Not on file   Additional Social History:    Allergies:  No Known Allergies  Labs:  Results for orders placed or performed during the hospital encounter of 02/16/22 (from the past 48 hour(s))  CBC with Differential     Status: Abnormal   Collection Time: 02/16/22  1:17 AM  Result Value Ref Range   WBC 7.5 4.0 - 10.5 K/uL   RBC 3.74 (L) 3.87 - 5.11 MIL/uL   Hemoglobin 12.2 12.0 - 15.0 g/dL   HCT 34.3 (L) 36.0 - 46.0 %   MCV 91.7 80.0 - 100.0 fL   MCH 32.6 26.0 - 34.0 pg   MCHC 35.6 30.0 - 36.0 g/dL   RDW 11.9 11.5 - 15.5 %   Platelets 256 150 - 400 K/uL   nRBC 0.0 0.0 - 0.2 %   Neutrophils Relative % 47 %   Neutro Abs 3.5 1.7 - 7.7 K/uL   Lymphocytes Relative 37 %   Lymphs Abs 2.8 0.7 - 4.0 K/uL   Monocytes Relative 10 %   Monocytes Absolute 0.8 0.1 - 1.0 K/uL   Eosinophils Relative 4 %   Eosinophils Absolute 0.3 0.0 - 0.5 K/uL   Basophils Relative 2 %   Basophils  Absolute 0.1 0.0 - 0.1 K/uL   Immature Granulocytes 0 %   Abs Immature Granulocytes 0.01 0.00 - 0.07 K/uL    Comment: Performed at [facility], [address], [place], [place] [address]  Ethanol     Status: Abnormal   Collection Time: 02/16/22  1:17 AM  Result Value Ref Range   Alcohol, Ethyl (B) 322 (HH) <10 mg/dL    Comment: CRITICAL RESULT CALLED TO, READ BACK BY AND VERIFIED WITH J. DODD, RN AT 6063 ON 02/16/22 BY H. HOWARD. (NOTE) Lowest detectable limit for serum alcohol is 10 mg/dL.  For medical purposes only. Performed at [facility], [place] [address] [place], [place] [address]   Comprehensive metabolic panel     Status: Abnormal   Collection Time: 02/16/22  1:17 AM  Result Value Ref Range   Sodium 128 (L) 135 - 145 mmol/L   Potassium 4.2 3.5 - 5.1 mmol/L   Chloride 89 (L) 98 - 111 mmol/L   CO2 22 22 - 32 mmol/L   Glucose, Bld 114 (H) 70 - 99 mg/dL    Comment: Glucose reference range applies only to samples taken after fasting for at least 8 hours.   BUN 9 6 - 20 mg/dL   Creatinine, Ser 0.87 0.44 - 1.00 mg/dL   Calcium 9.2 8.9 - 10.3 mg/dL   Total Protein 6.8 6.5 - 8.1 g/dL   Albumin 3.9 3.5 - 5.0 g/dL   AST 37 15 - 41 U/L   ALT 24 0 - 44 U/L   Alkaline Phosphatase 71 38 - 126 U/L   Total Bilirubin 0.6 0.3 - 1.2 mg/dL   GFR, Estimated >60 >60 mL/min    Comment: (NOTE) Calculated using the CKD-EPI Creatinine Equation (2021)    Anion gap 17 (H) 5 - 15    Comment: Performed at [facility] [address] [place], [place] [address]  Acetaminophen level     Status: Abnormal   Collection Time: 02/16/22  1:17 AM  Result Value Ref Range   Acetaminophen (Tylenol), Serum <10 (L) 10 - 30 ug/mL  Comment: (NOTE) Therapeutic concentrations vary significantly. A range of 10-30 ug/mL  may be an effective concentration for many patients. However, some  are best treated at concentrations outside of this range. Acetaminophen concentrations >150 ug/mL at 4  hours after ingestion  and >50 ug/mL at 12 hours after ingestion are often associated with  toxic reactions.  Performed at [facility], [place] [address] [place], [place] [address]   Salicylate level     Status: Abnormal   Collection Time: 02/16/22  1:17 AM  Result Value Ref Range   Salicylate Lvl <2.7 (L) 7.0 - 30.0 mg/dL    Comment: Performed at [facility] [address], [place], [place] [address]    Current Facility-Administered Medications  Medication Dose Route Frequency Provider Last Rate Last Admin   lactated ringers bolus 2,000 mL  2,000 mL Intravenous Once Antonietta Breach, PA-C       Current Outpatient Medications  Medication Sig Dispense Refill   amoxicillin-clavulanate (AUGMENTIN) 875-125 MG tablet Take 1 tablet by mouth 2 (two) times daily. One po bid x 7 days (Patient not taking: Reported on 01/14/2021) 14 tablet 0   Ascorbic Acid (VITAMIN C) 1000 MG tablet Take 1,000 mg by mouth daily.     Calcium-Magnesium-Zinc 167-83-8 MG TABS Take 1 tablet by mouth daily.     COLLAGEN PO Take 6,000 mg by mouth daily. (Patient not taking: No sig reported)     cyclobenzaprine (FLEXERIL) 10 MG tablet Take 1 tablet (10 mg total) by mouth at bedtime. 30 tablet 2   desvenlafaxine (PRISTIQ) 100 MG 24 hr tablet Take 100 mg by mouth daily. (Patient not taking: No sig reported)     magic mouthwash (nystatin, lidocaine, diphenhydrAMINE, alum & mag hydroxide) suspension Swish and spit 5 mLs 4 (four) times daily as needed for mouth pain. (Patient not taking: Reported on 01/14/2021) 180 mL 0   methylphenidate 18 MG PO CR tablet Take 18 mg by mouth daily. (Patient not taking: No sig reported)     MILK THISTLE PO Take 400 mg by mouth daily. (Patient not taking: No sig reported)     Misc Natural Products (TURMERIC CURCUMIN) CAPS Take 1 capsule by mouth daily.     Multiple Vitamin (MULTIVITAMIN WITH MINERALS) TABS tablet Take 1 tablet by mouth daily.     pantoprazole (PROTONIX) 20 MG tablet  TAKE 1 TABLET (20 MG TOTAL) BY MOUTH DAILY. (Patient not taking: No sig reported) 30 tablet 6   Probiotic Product (PROBIOTIC ADVANCED) CAPS Take 1 capsule by mouth daily.     vortioxetine HBr (TRINTELLIX) 20 MG TABS tablet Trintellix 20 mg tablet  TAKE 1 TABLET BY MOUTH EVERY DAY AS DIRECTED      Psychiatric Specialty Exam: Presentation  General Appearance:  Appropriate for Environment  Eye Contact: Good  Speech: Clear and Coherent  Speech Volume: Normal  Handedness: Right   Mood and Affect  Mood: Anxious; Depressed  Affect: Tearful   Thought Process  Thought Processes: Goal Directed  Descriptions of Associations:Intact  Orientation:Full (Time, Place and Person)  Thought Content:Logical  History of Schizophrenia/Schizoaffective disorder:No data recorded Duration of Psychotic Symptoms:No data recorded Hallucinations:Hallucinations: None  Ideas of Reference:None  Suicidal Thoughts:Suicidal Thoughts: Yes, Passive (she took 2 Xanax to sleep was not attempting to end her life)  Homicidal Thoughts:Homicidal Thoughts: No   Sensorium  Memory: Immediate Good  Judgment: Intact  Insight: Good   Executive Functions  Concentration: Fair  Attention Span: Fair  Recall: Good  Fund of Knowledge: Good  Language: Good   Psychomotor Activity  Psychomotor Activity: Psychomotor Activity: Normal   Assets  Assets: Communication Skills; Social Support; Physical Health; Resilience; Financial Resources/Insurance; Desire for Improvement    Sleep  Sleep: Sleep: Poor   Physical Exam: Physical Exam HENT:     Head: Normocephalic and atraumatic.     Right Ear: External ear normal.     Left Ear: External ear normal.  Eyes:     Extraocular Movements: Extraocular movements intact.     Conjunctiva/sclera: Conjunctivae normal.     Pupils: Pupils are equal, round, and reactive to light.  Cardiovascular:     Rate and Rhythm: Normal rate.   Pulmonary:     Effort: Pulmonary effort is normal.     Breath sounds: Normal breath sounds.  Musculoskeletal:     Cervical back: Normal range of motion.  Skin:    General: Skin is warm.  Neurological:     General: No focal deficit present.     Mental Status: She is alert.    Review of Systems  Psychiatric/Behavioral:  Positive for depression and substance abuse. Negative for hallucinations, memory loss and suicidal ideas. The patient is nervous/anxious and has insomnia.    Blood pressure (!) 124/92, pulse 98, temperature 98.4 F (36.9 C), temperature source Oral, resp. rate 18, height '5\' 10"'$  (1.778 m), weight 61.2 kg, SpO2 95 %. Body mass index is 19.37 kg/m.  Medical Decision Making: Patient case review and discussed with Dr. Dwyane Dee, patient does not meet inpatient criteria however has opted to voluntarily self admission to an inpatient treatment facility for alcohol dependence and mental health crisis. Husband is at bedside and transporting patient by private vehicle to [facility] facility in [place].   Disposition: No evidence of imminent risk to self or others at present.   Discussed crisis plan, support from social network, calling 911, coming to the Emergency Department, and calling Suicide Hotline.   Molli Barrows, FNP-C, PMHNP-BC 02/16/2022 2:55 PM

## 2022-02-16 NOTE — ED Provider Notes (Signed)
Patient initially was seen and evaluated for suicidal ideation and alcohol intoxication.  Patient is medically cleared at this time.  Psychiatry has seen evaluate patient and agrees with her being discharged to go to a facility in [place] that has a bed available.  She is stable to be discharged.  BP (!) 158/107 (BP Location: Right Arm)   Pulse (!) 101   Temp 97.8 F (36.6 C)   Resp 18   Ht '5\' 10"'$  (1.778 m)   Wt 61.2 kg   SpO2 93%   BMI 19.37 kg/m   Results for orders placed or performed during the hospital encounter of 02/16/22  CBC with Differential  Result Value Ref Range   WBC 7.5 4.0 - 10.5 K/uL   RBC 3.74 (L) 3.87 - 5.11 MIL/uL   Hemoglobin 12.2 12.0 - 15.0 g/dL   HCT 34.3 (L) 36.0 - 46.0 %   MCV 91.7 80.0 - 100.0 fL   MCH 32.6 26.0 - 34.0 pg   MCHC 35.6 30.0 - 36.0 g/dL   RDW 11.9 11.5 - 15.5 %   Platelets 256 150 - 400 K/uL   nRBC 0.0 0.0 - 0.2 %   Neutrophils Relative % 47 %   Neutro Abs 3.5 1.7 - 7.7 K/uL   Lymphocytes Relative 37 %   Lymphs Abs 2.8 0.7 - 4.0 K/uL   Monocytes Relative 10 %   Monocytes Absolute 0.8 0.1 - 1.0 K/uL   Eosinophils Relative 4 %   Eosinophils Absolute 0.3 0.0 - 0.5 K/uL   Basophils Relative 2 %   Basophils Absolute 0.1 0.0 - 0.1 K/uL   Immature Granulocytes 0 %   Abs Immature Granulocytes 0.01 0.00 - 0.07 K/uL  Ethanol  Result Value Ref Range   Alcohol, Ethyl (B) 322 (HH) <10 mg/dL  Comprehensive metabolic panel  Result Value Ref Range   Sodium 128 (L) 135 - 145 mmol/L   Potassium 4.2 3.5 - 5.1 mmol/L   Chloride 89 (L) 98 - 111 mmol/L   CO2 22 22 - 32 mmol/L   Glucose, Bld 114 (H) 70 - 99 mg/dL   BUN 9 6 - 20 mg/dL   Creatinine, Ser 0.87 0.44 - 1.00 mg/dL   Calcium 9.2 8.9 - 10.3 mg/dL   Total Protein 6.8 6.5 - 8.1 g/dL   Albumin 3.9 3.5 - 5.0 g/dL   AST 37 15 - 41 U/L   ALT 24 0 - 44 U/L   Alkaline Phosphatase 71 38 - 126 U/L   Total Bilirubin 0.6 0.3 - 1.2 mg/dL   GFR, Estimated >60 >60 mL/min   Anion gap 17 (H) 5 - 15   Acetaminophen level  Result Value Ref Range   Acetaminophen (Tylenol), Serum <10 (L) 10 - 30 ug/mL  Salicylate level  Result Value Ref Range   Salicylate Lvl <5.7 (L) 7.0 - 30.0 mg/dL   No results found.    Domenic Moras, PA-C 02/16/22 1326    Milton Ferguson, MD 02/20/22 [phone]
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
## 2022-02-16 NOTE — ED Triage Notes (Signed)
Pt arrived to triage with spouse after having a panic attack and suicidal ideations  Family member states that tonight she had some ETOH and took some xanax. Pt also was making statements and went to get a gun out of the office   Pt is having flight of ideas, under a lot of stress this time of year and problems with marriage

## 2022-02-16 NOTE — ED Notes (Signed)
Pt didn't answer for vital check
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
## 2022-02-16 NOTE — Discharge Instructions (Signed)
Go immediately today to [facility] in [place] for inpatient treatment.  I have provided resources for outpatient psychiatric treatment once you are discharged or you may establish psychiatric care from any outpatient provider of your choosing.

## 2022-05-18 ENCOUNTER — Other Ambulatory Visit: Payer: Self-pay | Admitting: Physician Assistant

## 2022-05-18 DIAGNOSIS — R519 Headache, unspecified: Secondary | ICD-10-CM

## 2022-05-18 DIAGNOSIS — M542 Cervicalgia: Secondary | ICD-10-CM

## 2022-05-18 DIAGNOSIS — J984 Other disorders of lung: Secondary | ICD-10-CM

## 2022-05-25 ENCOUNTER — Inpatient Hospital Stay: Admission: RE | Admit: 2022-05-25 | Payer: 59 | Source: Ambulatory Visit

## 2022-06-23 ENCOUNTER — Ambulatory Visit
Admission: RE | Admit: 2022-06-23 | Discharge: 2022-06-23 | Disposition: A | Discharge status: Home / Self Care | Payer: 59 | Source: Ambulatory Visit | Attending: Physician Assistant | Admitting: Physician Assistant

## 2022-06-23 DIAGNOSIS — J984 Other disorders of lung: Secondary | ICD-10-CM

## 2022-06-23 DIAGNOSIS — R519 Headache, unspecified: Secondary | ICD-10-CM

## 2022-06-23 DIAGNOSIS — M542 Cervicalgia: Secondary | ICD-10-CM

## 2022-06-29 ENCOUNTER — Other Ambulatory Visit (HOSPITAL_COMMUNITY): Payer: Self-pay | Admitting: Internal Medicine

## 2022-06-29 DIAGNOSIS — R55 Syncope and collapse: Secondary | ICD-10-CM

## 2022-08-04 ENCOUNTER — Ambulatory Visit (HOSPITAL_COMMUNITY): Payer: 59

## 2022-08-05 ENCOUNTER — Ambulatory Visit (HOSPITAL_COMMUNITY): Payer: 59 | Attending: Internal Medicine

## 2022-08-05 DIAGNOSIS — R55 Syncope and collapse: Secondary | ICD-10-CM | POA: Insufficient documentation

## 2022-08-05 LAB — ECHOCARDIOGRAM COMPLETE
Area-P 1/2: 4.21 cm2
S' Lateral: 3.1 cm

## 2022-12-06 ENCOUNTER — Other Ambulatory Visit (HOSPITAL_COMMUNITY): Payer: Self-pay | Admitting: Sports Medicine

## 2022-12-06 ENCOUNTER — Ambulatory Visit (HOSPITAL_COMMUNITY)
Admission: RE | Admit: 2022-12-06 | Discharge: 2022-12-06 | Disposition: A | Discharge status: Home / Self Care | Payer: 59 | Source: Ambulatory Visit | Attending: Sports Medicine | Admitting: Sports Medicine

## 2022-12-06 DIAGNOSIS — M79604 Pain in right leg: Secondary | ICD-10-CM

## 2022-12-06 DIAGNOSIS — M7989 Other specified soft tissue disorders: Secondary | ICD-10-CM

## 2022-12-29 ENCOUNTER — Encounter: Payer: Self-pay | Admitting: Orthopedic Surgery

## 2022-12-29 ENCOUNTER — Other Ambulatory Visit: Payer: Self-pay | Admitting: Orthopedic Surgery

## 2022-12-29 DIAGNOSIS — M25561 Pain in right knee: Secondary | ICD-10-CM

## 2022-12-30 ENCOUNTER — Ambulatory Visit
Admission: RE | Admit: 2022-12-30 | Discharge: 2022-12-30 | Disposition: A | Discharge status: Home / Self Care | Payer: 59 | Source: Ambulatory Visit | Attending: Orthopedic Surgery | Admitting: Orthopedic Surgery

## 2022-12-30 DIAGNOSIS — M25561 Pain in right knee: Secondary | ICD-10-CM

## 2023-02-12 IMAGING — CT CT CHEST HIGH RESOLUTION W/O CM
3 of 6 series · 17 of 34 positions shown, 19 images · non-contrast
Comparison: 04/07/2006.

CLINICAL DATA: Scleroderma and shortness of breath.

EXAM:
CT CHEST WITHOUT CONTRAST
TECHNIQUE: Multidetector CT imaging of the chest was performed following the
standard protocol without intravenous contrast. High resolution
imaging of the lungs, as well as inspiratory and expiratory imaging,
was performed.

[Series 2: chest · axial · 0.66mm/px · z∈[-281,-57]mm · 6 of 169 slices shown, 8 images]
[im 29/169  mediastinal]
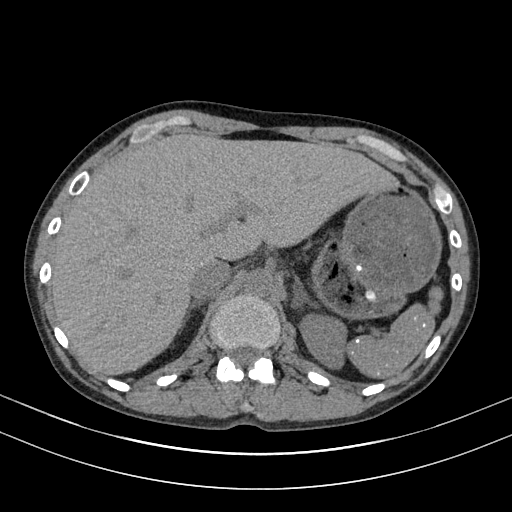
[im 29/169  lung]
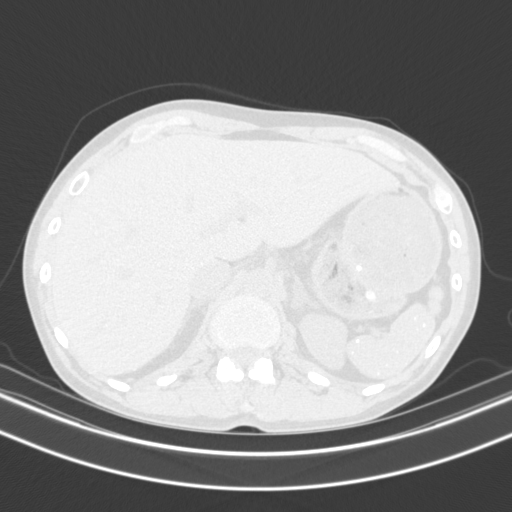
[im 57/169  lung]
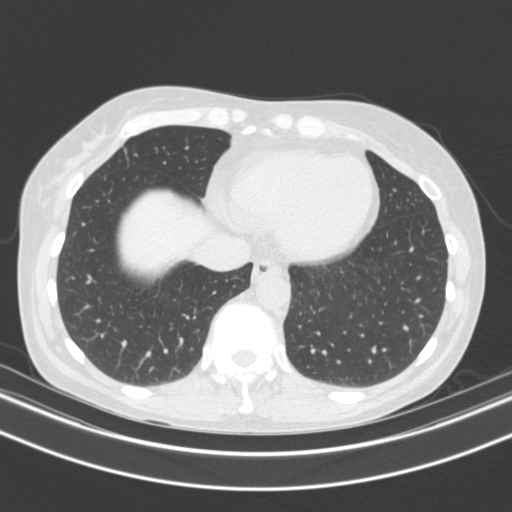
[im 80/169  lung]
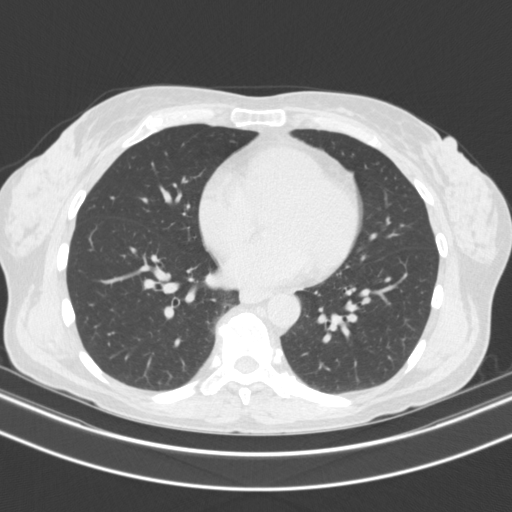
[im 85/169  lung]
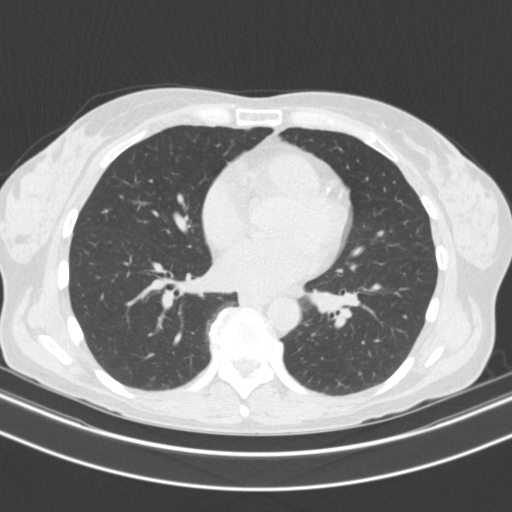
[im 113/169  mediastinal]
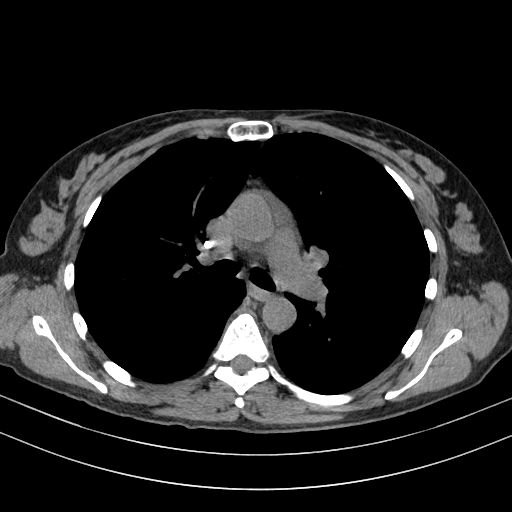
[im 113/169  lung]
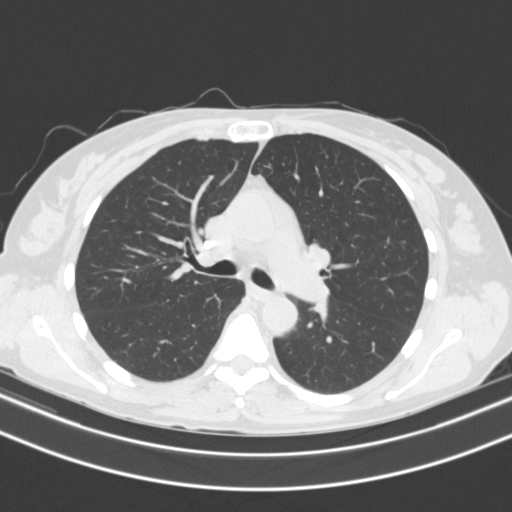
[im 141/169  lung]
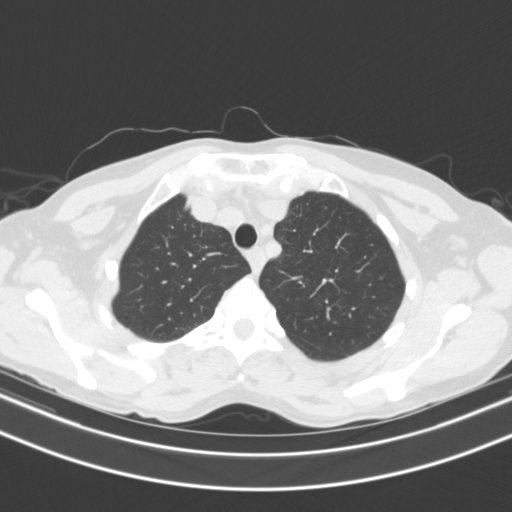

[Series 7: lungs · axial · 0.66mm/px · z∈[-219,-165]mm · 3 of 137 slices shown]
[im 28/137  lung]
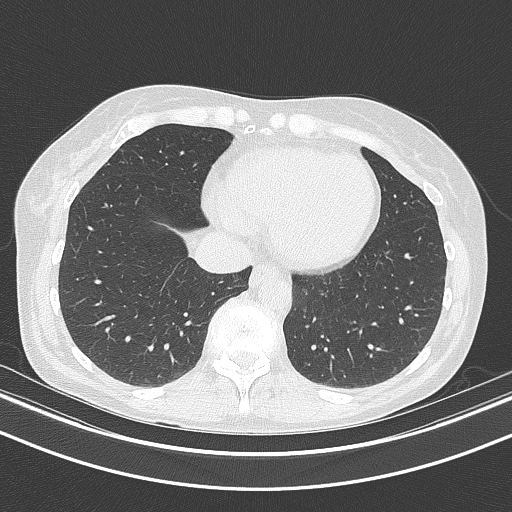
[im 48/137  lung]
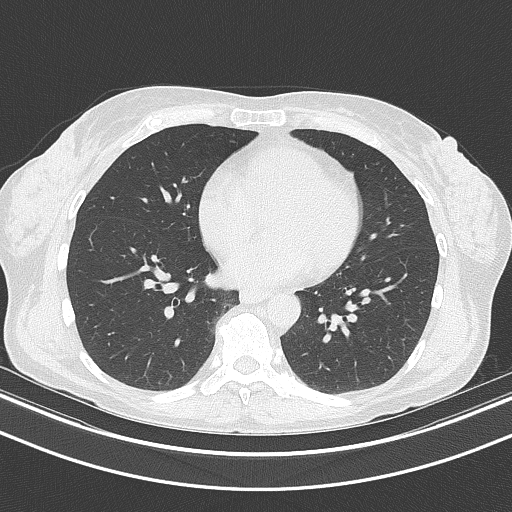
[im 55/137  lung]
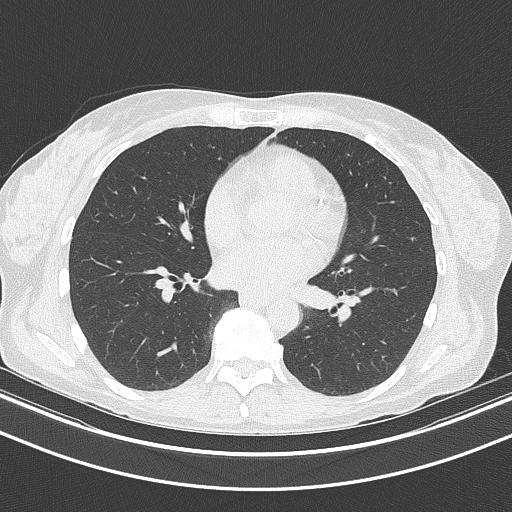

[Series 10: hi res 1mm · axial · 0.66mm/px · z∈[-250,-26]mm · 8 of 274 slices shown]
[im 25/274  lung]
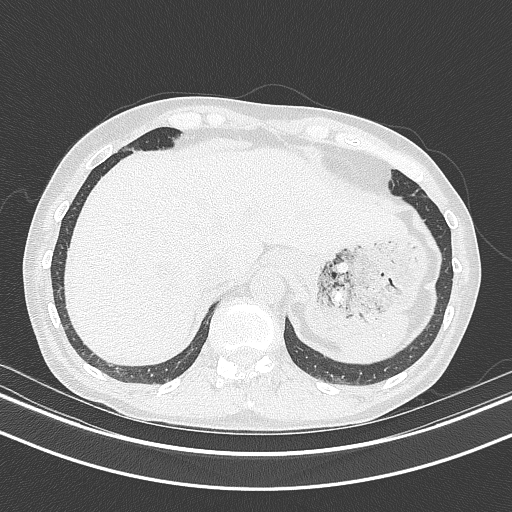
[im 75/274  lung]
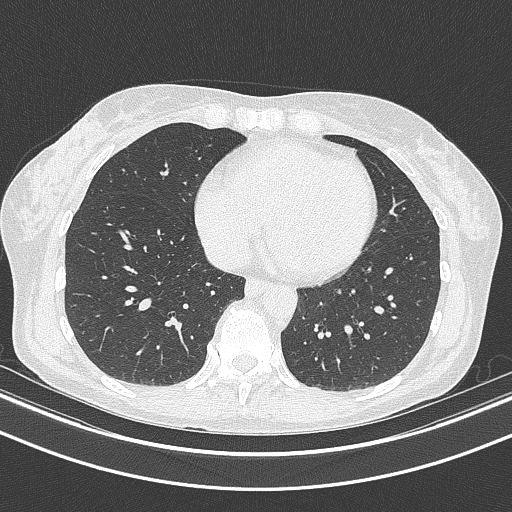
[im 96/274  lung]
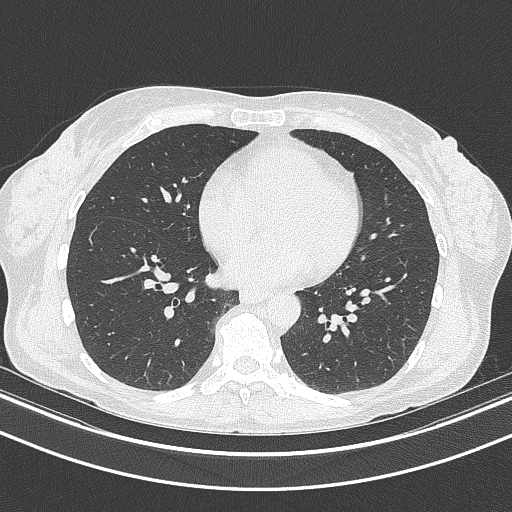
[im 100/274  lung]
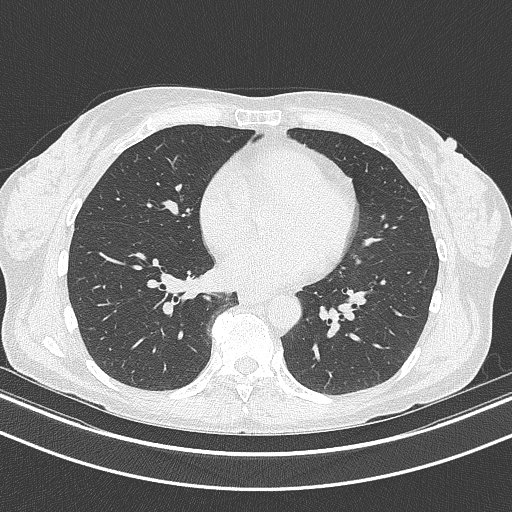
[im 149/274  lung]
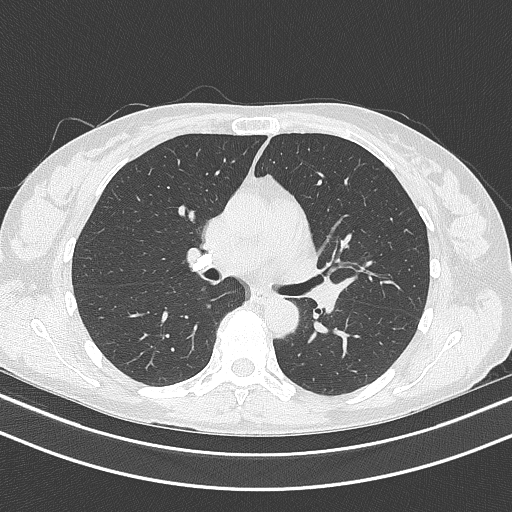
[im 174/274  lung]
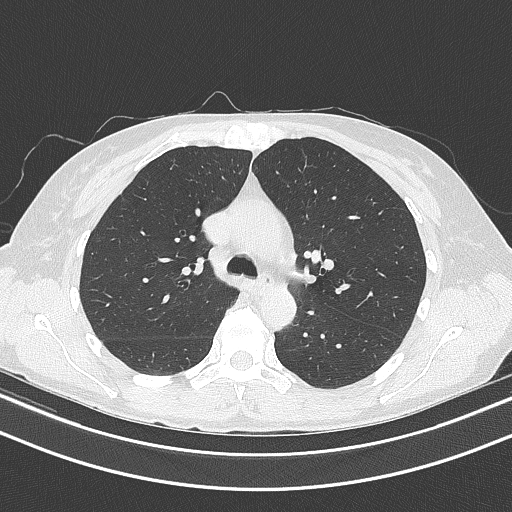
[im 199/274  lung]
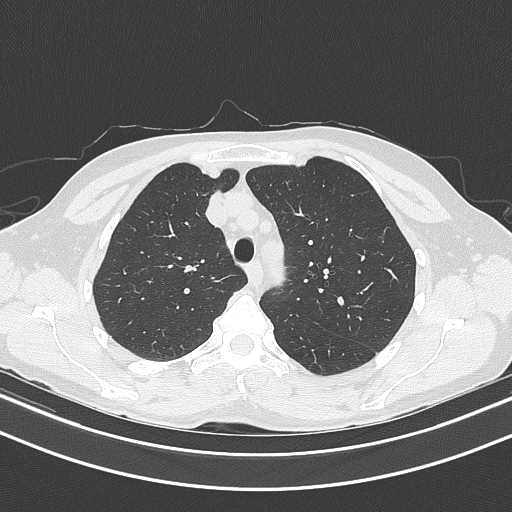
[im 249/274  lung]
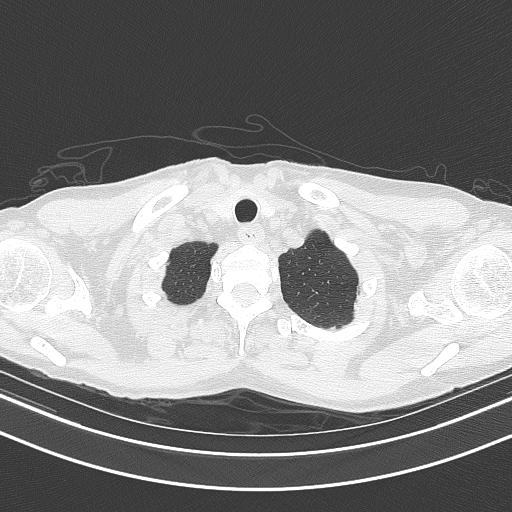

[17 of 34 positions shown; findings below may reference images not displayed]

FINDINGS: Cardiovascular: Atherosclerotic calcification of the aorta and
coronary arteries. Heart is at the upper limits of normal in size.
No pericardial effusion.

Mediastinum/Nodes: Calcified mediastinal and hilar lymph nodes. No
pathologically enlarged mediastinal or axillary lymph nodes. Hilar
regions are difficult to definitively evaluate without IV contrast.
Esophagus is grossly unremarkable.

Lungs/Pleura: Calcified granulomas. Negative for subpleural
reticulation, traction bronchiectasis/bronchiolectasis,
ground-glass, architectural distortion or honeycombing. Minimal
scattered pulmonary parenchymal scarring. Minimal dependent
atelectasis bilaterally. 3 mm subpleural lymph node along the
lateral left lower lobe (7/109), unchanged. Lungs are otherwise
clear. No pleural fluid. Airway is unremarkable. Minimal air
trapping.

Upper Abdomen: There may be scattered subcentimeter low-attenuation
lesions in the liver, too small to characterize. Visualized portions
of the liver, gallbladder and right adrenal gland are otherwise
unremarkable. Slight thickening of the left adrenal gland.
Visualized portions of the kidneys, spleen, pancreas, stomach and
bowel are otherwise unremarkable.

Musculoskeletal: Degenerative changes in the spine. No worrisome
lytic or sclerotic lesions.
IMPRESSION: 1. No evidence of interstitial lung disease. Air trapping is
indicative of small airways disease.
2. Aortic atherosclerosis ([id number]). Coronary artery
calcification.

## 2023-05-15 ENCOUNTER — Encounter (HOSPITAL_BASED_OUTPATIENT_CLINIC_OR_DEPARTMENT_OTHER): Payer: Self-pay

## 2023-05-15 ENCOUNTER — Emergency Department (HOSPITAL_BASED_OUTPATIENT_CLINIC_OR_DEPARTMENT_OTHER)

## 2023-05-15 DIAGNOSIS — M25461 Effusion, right knee: Secondary | ICD-10-CM | POA: Diagnosis present

## 2023-05-15 LAB — BASIC METABOLIC PANEL
Anion gap: 10 (ref 5–15)
BUN: 18 mg/dL (ref 6–20)
CO2: 27 mmol/L (ref 22–32)
Calcium: 9.5 mg/dL (ref 8.9–10.3)
Chloride: 97 mmol/L — ABNORMAL LOW (ref 98–111)
Creatinine, Ser: 0.85 mg/dL (ref 0.44–1.00)
GFR, Estimated: >60 mL/min (ref >60)
Glucose, Bld: 98 mg/dL (ref 70–99)
Potassium: 4 mmol/L (ref 3.5–5.1)
Sodium: 134 mmol/L — ABNORMAL LOW (ref 135–145)

## 2023-05-15 LAB — CBC
HCT: 37.1 % (ref 36.0–46.0)
Hemoglobin: 12.6 g/dL (ref 12.0–15.0)
MCH: 32.1 pg (ref 26.0–34.0)
MCHC: 34 g/dL (ref 30.0–36.0)
MCV: 94.6 fL (ref 80.0–100.0)
Platelets: 327 10*3/uL (ref 150–400)
RBC: 3.92 MIL/uL (ref 3.87–5.11)
RDW: 11.9 % (ref 11.5–15.5)
WBC: 7.2 10*3/uL (ref 4.0–10.5)
nRBC: 0 % (ref 0.0–0.2)

## 2023-05-15 NOTE — ED Provider Triage Note (Addendum)
 Emergency Medicine Provider Triage Evaluation Note  Connie Bond , a 58 y.o. female  was evaluated in triage.  Pt complains of RLE pain. Patient 3 weeks sp meniscus repair. Was at PT today where they noted worsening swelling of R knee. Has been going to exercise classes and PT. Does have slight swelling to anterior leg. No SOB, CP. No LH, dizzy, weakness. No hx of blood clots. Patient was advised to come to ED tonight by Dr. Aundria Rud, orthopedic surgeon, to rule out DVT.  Review of Systems  Positive:  Negative:   Physical Exam  There were no vitals taken for this visit. Gen:   Awake, no distress   Resp:  Normal effort  MSK:   Moves extremities without difficulty  Other:  Swelling to R knee. Negative Homan sign.  Medical Decision Making  Medically screening exam initiated at 7:49 PM.  Appropriate orders placed.  Connie Bond was informed that the remainder of the evaluation will be completed by another provider, this initial triage assessment does not replace that evaluation, and the importance of remaining in the ED until their evaluation is complete.       Al Decant, PA-C 05/15/23 2014

## 2023-05-15 NOTE — ED Triage Notes (Signed)
 Pt c/o "knot" on R shin s/p meniscus repair approx 53mo ago. Pt denies SHOB, CP  Pt negative for homan sign

## 2023-05-16 ENCOUNTER — Other Ambulatory Visit: Payer: Self-pay

## 2023-05-16 ENCOUNTER — Emergency Department (HOSPITAL_BASED_OUTPATIENT_CLINIC_OR_DEPARTMENT_OTHER)
Admission: EM | Admit: 2023-05-16 | Discharge: 2023-05-16 | Disposition: A | Discharge status: Home / Self Care | Attending: Emergency Medicine | Admitting: Emergency Medicine

## 2023-05-16 DIAGNOSIS — M7989 Other specified soft tissue disorders: Secondary | ICD-10-CM

## 2023-05-16 MED ORDER — KETOROLAC TROMETHAMINE 15 MG/ML IJ SOLN
15.0000 mg | Freq: Once | INTRAMUSCULAR | Status: AC
  Administered 2023-05-16: 15 mg via INTRAMUSCULAR
  Filled 2023-05-16: qty 1

## 2023-05-16 MED ORDER — HYDROCODONE-ACETAMINOPHEN 5-325 MG PO TABS
1.0000 | ORAL_TABLET | Freq: Once | ORAL | Status: AC
  Administered 2023-05-16: 1 via ORAL
  Filled 2023-05-16: qty 1

## 2023-05-16 MED ORDER — NAPROXEN 500 MG PO TABS: 500.0000 mg | ORAL_TABLET | Freq: Two times a day (BID) | ORAL | 0 refills | Status: AC | PRN

## 2023-05-16 NOTE — Discharge Instructions (Signed)
 Ultrasound shows no blood clot.  Does show possible Baker's cyst and postoperative fluid collection.  Follow-up with Dr. Aundria Rud tomorrow as scheduled.  You declined arthrocentesis or drainage of fluid today.  Return to the ED with worsening pain, fever, vomiting, chest pain, shortness of breath or any concerns.

## 2023-05-16 NOTE — ED Provider Notes (Addendum)
 [place] EMERGENCY DEPARTMENT AT [facility] Provider Note   CSN: [id number] Arrival date & time: 05/15/23  1922     History  Chief Complaint  Patient presents with   Leg Pain    R    Connie Bond is a 58 y.o. female.  Patient status post right meniscus repair about a month ago by Dr. Aundria Rud.  He is here with swelling and bruising to her right lower leg that she noticed today.  Did go to physical therapy today when she noticed worsening swelling of her right knee.  Did have fluid drained from her right knee in the orthopedic office about 2 weeks ago.  Today she was concerned there was an area of swelling and bruising to her medial lower leg.  No chest pain or shortness of breath.  No fever.  No lightheadedness or dizziness.  No history of blood clots.  No fever.  No chest pain. Full range of motion of the knee without pain  The history is provided by the patient.  Leg Pain Associated symptoms: no fever        Home Medications Prior to Admission medications   Medication Sig Start Date End Date Taking? Authorizing Provider  amoxicillin-clavulanate (AUGMENTIN) 875-125 MG tablet Take 1 tablet by mouth 2 (two) times daily. One po bid x 7 days Patient not taking: Reported on 01/14/2021 11/02/20   Rosezella Rumpf, PA-C  Ascorbic Acid (VITAMIN C) 1000 MG tablet Take 1,000 mg by mouth daily.    [provider]  Calcium-Magnesium-Zinc [phone] MG TABS Take 1 tablet by mouth daily.    [provider]  COLLAGEN PO Take 6,000 mg by mouth daily. Patient not taking: No sig reported    [provider]  cyclobenzaprine (FLEXERIL) 10 MG tablet Take 1 tablet (10 mg total) by mouth at bedtime. 12/11/14   Collene Gobble, MD  desvenlafaxine (PRISTIQ) 100 MG 24 hr tablet Take 100 mg by mouth daily. Patient not taking: No sig reported    [provider]  magic mouthwash (nystatin, lidocaine, diphenhydrAMINE, alum & mag hydroxide) suspension Swish and  spit 5 mLs 4 (four) times daily as needed for mouth pain. Patient not taking: Reported on 01/14/2021 11/03/20   Melene Plan, DO  methylphenidate 18 MG PO CR tablet Take 18 mg by mouth daily. Patient not taking: No sig reported    [provider]  MILK THISTLE PO Take 400 mg by mouth daily. Patient not taking: No sig reported    [provider]  Misc Natural Products (TURMERIC CURCUMIN) CAPS Take 1 capsule by mouth daily.    [provider]  Multiple Vitamin (MULTIVITAMIN WITH MINERALS) TABS tablet Take 1 tablet by mouth daily.    [provider]  pantoprazole (PROTONIX) 20 MG tablet TAKE 1 TABLET (20 MG TOTAL) BY MOUTH DAILY. Patient not taking: No sig reported 12/08/16   Napoleon Form, MD  Probiotic Product (PROBIOTIC ADVANCED) CAPS Take 1 capsule by mouth daily.    [provider]  vortioxetine HBr (TRINTELLIX) 20 MG TABS tablet Trintellix 20 mg tablet  TAKE 1 TABLET BY MOUTH EVERY DAY AS DIRECTED    [provider]      Allergies    Patient has no known allergies.    Review of Systems   Review of Systems  Constitutional:  Negative for activity change, appetite change and fever.  HENT:  Negative for congestion and rhinorrhea.   Respiratory:  Negative for cough,  chest tightness and shortness of breath.   Cardiovascular:  Positive for leg swelling.  Gastrointestinal:  Negative for abdominal pain, nausea and vomiting.  Genitourinary:  Negative for dysuria and hematuria.  Musculoskeletal:  Positive for arthralgias and myalgias.  Skin:  Negative for rash.  Neurological:  Negative for dizziness and weakness.    all other systems are negative except as noted in the HPI and PMH.   Physical Exam Updated Vital Signs BP 118/77   Pulse 84   Temp (!) 97.3 F (36.3 C)   Resp 14   SpO2 100%  Physical Exam Vitals and nursing note reviewed.  Constitutional:      General: She is not in acute distress.    Appearance: She is  well-developed.  HENT:     Head: Normocephalic and atraumatic.     Mouth/Throat:     Pharynx: No oropharyngeal exudate.  Eyes:     Conjunctiva/sclera: Conjunctivae normal.     Pupils: Pupils are equal, round, and reactive to light.  Neck:     Comments: No meningismus. Cardiovascular:     Rate and Rhythm: Normal rate and regular rhythm.     Heart sounds: Normal heart sounds. No murmur heard. Pulmonary:     Effort: Pulmonary effort is normal. No respiratory distress.     Breath sounds: Normal breath sounds.  Abdominal:     Palpations: Abdomen is soft.     Tenderness: There is no abdominal tenderness. There is no guarding or rebound.  Musculoskeletal:        General: Swelling and tenderness present.     Cervical back: Normal range of motion and neck supple.     Comments: Swelling of right knee with effusion.  No warmth or erythema. Small area of ecchymosis right lateral calf.  Compartments soft. Full range of flexion extension of the right knee.  No overlying warmth or erythema.  Intact DP and PT pulse.  Skin:    General: Skin is warm.  Neurological:     Mental Status: She is alert and oriented to person, place, and time.     Cranial Nerves: No cranial nerve deficit.     Motor: No abnormal muscle tone.     Coordination: Coordination normal.     Comments:  5/5 strength throughout. CN 2-12 intact.Equal grip strength.   Psychiatric:        Behavior: Behavior normal.     ED Results / Procedures / Treatments   Labs (all labs ordered are listed, but only abnormal results are displayed) Labs Reviewed  BASIC METABOLIC PANEL - Abnormal; Notable for the following components:      Result Value   Sodium 134 (*)    Chloride 97 (*)    All other components within normal limits  CBC    EKG None  Radiology US Venous Img Lower Right (DVT Study) Result Date: 05/15/2023 CLINICAL DATA:  Status post meniscus repair 3 weeks ago, presenting with right lower extremity swelling EXAM: RIGHT  LOWER EXTREMITY VENOUS DOPPLER ULTRASOUND TECHNIQUE: Gray-scale sonography with graded compression, as well as color Doppler and duplex ultrasound were performed to evaluate the lower extremity deep venous systems from the level of the common femoral vein and including the common femoral, femoral, profunda femoral, popliteal and calf veins including the posterior tibial, peroneal and gastrocnemius veins when visible. The superficial great saphenous vein was also interrogated. Spectral Doppler was utilized to evaluate flow at rest and with distal augmentation maneuvers in the common femoral, femoral and popliteal  veins. COMPARISON:  None Available. FINDINGS: Contralateral Common Femoral Vein: Respiratory phasicity is normal and symmetric with the symptomatic side. No evidence of thrombus. Normal compressibility. Common Femoral Vein: No evidence of thrombus. Normal compressibility, respiratory phasicity and response to augmentation. Saphenofemoral Junction: No evidence of thrombus. Normal compressibility and flow on color Doppler imaging. Profunda Femoral Vein: No evidence of thrombus. Normal compressibility and flow on color Doppler imaging. Femoral Vein: No evidence of thrombus. Normal compressibility, respiratory phasicity and response to augmentation. Popliteal Vein: No evidence of thrombus. Normal compressibility, respiratory phasicity and response to augmentation. Calf Veins: No evidence of thrombus. Normal compressibility and flow on color Doppler imaging. Superficial Great Saphenous Vein: No evidence of thrombus. Normal compressibility. Venous Reflux:  None. Other Findings: A 7.1 cm x 1.7 cm x 3.3 cm area of complex hypoechogenicity is seen within the medial aspect of the right popliteal fossa. No abnormal flow is noted within this area on color Doppler evaluation. IMPRESSION: 1. No evidence of deep venous thrombosis within the RIGHT lower extremity. 2. Large complex appearing area along the medial aspect of  the right popliteal fossa. While this may be, in part, postoperative in nature, the presence of a large complex Baker's cyst cannot be excluded. Electronically Signed   By: Aram Candela M.D.   On: 05/15/2023 23:53    Procedures Procedures    Medications Ordered in ED Medications  ketorolac (TORADOL) 15 MG/ML injection 15 mg (15 mg Intramuscular Given 05/16/23 0100)  HYDROcodone-acetaminophen (NORCO/VICODIN) 5-325 MG per tablet 1 tablet (1 tablet Oral Given 05/16/23 0100)    ED Course/ Medical Decision Making/ A&P                                 Medical Decision Making Amount and/or Complexity of Data Reviewed Labs: ordered. Decision-making details documented in ED Course. Radiology: ordered and independent interpretation performed. Decision-making details documented in ED Course. ECG/medicine tests: ordered and independent interpretation performed. Decision-making details documented in ED Course.  Risk Prescription drug management.   Right leg and knee swelling 1 month after surgery.  Vital stable.  No distress.  No chest pain or shortness of breath.  No evidence of septic joint.  No warmth or erythema but she does have knee effusion.  Patient states she is going to see Dr. Aundria Rud in the morning and will request another arthrocentesis.  No indication for emergent arthrocentesis tonight.  She has full range of motion of the knee and there is no warmth or erythema with low suspicion for septic joint. She declines therapeutic arthrocentesis today.  ultrasound in triage is negative for DVT.  Does show fluid collection and possible Baker's cyst.  Discussed NSAIDs, ice, elevation, follow-up with orthopedics tomorrow as scheduled.  No indication for emergent arthrocentesis tonight.  Low suspicion for PE.  No chest pain or shortness of breath.  Return to the ED for worsening pain, chest pain, shortness of breath, or other concerns peer        Final Clinical Impression(s) / ED  Diagnoses Final diagnoses:  None    Rx / DC Orders ED Discharge Orders     None         Peterson Mathey, Jeannett Senior, MD 05/16/23 Estrella Myrtle    Glynn Octave, MD 05/16/23 [phone]

## 2023-10-11 ENCOUNTER — Other Ambulatory Visit: Payer: Self-pay | Admitting: Neurological Surgery

## 2023-10-11 DIAGNOSIS — M542 Cervicalgia: Secondary | ICD-10-CM

## 2023-10-30 ENCOUNTER — Other Ambulatory Visit

## 2023-11-14 ENCOUNTER — Ambulatory Visit
Admission: RE | Admit: 2023-11-14 | Discharge: 2023-11-14 | Disposition: A | Discharge status: Home / Self Care | Source: Ambulatory Visit | Attending: Neurological Surgery | Admitting: Neurological Surgery

## 2023-11-14 DIAGNOSIS — M542 Cervicalgia: Secondary | ICD-10-CM

## 2024-02-14 NOTE — Progress Notes (Signed)
\   Entreed in error

## 2024-02-15 ENCOUNTER — Ambulatory Visit: Admitting: Family Medicine

## 2024-02-22 ENCOUNTER — Ambulatory Visit: Admitting: Family Medicine

## 2024-02-29 NOTE — Progress Notes (Unsigned)
 [facility] [address] [place] [address] Phone: [phone] Subjective:    I'm seeing this patient by the request  of:  Bethena, Abigail M, PA  CC:   YEP:Dlagzrupcz  Connie Bond is a 58 y.o. 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 Social History   Socioeconomic History   Marital status: Married    Spouse name: Not on file   Number of children: 1   Years of education: Not on file   Highest education level: Not on file  Occupational History   Occupation: Homemaker  Tobacco Use   Smoking status: Every Day    Current packs/day: 0.20    Average packs/day: 0.2 packs/day for 28.0 years (5.6 ttl pk-yrs)    Types: Cigarettes   Smokeless tobacco: Never   Tobacco comments:    Currently smokes every other day 1 - 2 cigarettes after dinner ARJ, RN   Substance and Sexual Activity   Alcohol use: Yes    Alcohol/week: 0.0 standard drinks of alcohol   Drug use: No    Types: Cocaine   Sexual activity: Not on file  Other Topics Concern   Not on file  Social History Narrative   Not on file   Social Drivers of Health   Tobacco Use: High Risk (05/15/2023)   Patient History    Smoking Tobacco Use: Every Day    Smokeless Tobacco Use: Never    Passive Exposure: Not on file  Financial Resource Strain: Not on file  Food Insecurity: Not on file  Transportation Needs: Not on file  Physical Activity: Not on file  Stress: Not on file  Social Connections: Unknown (12/23/2022)   Received from [facility]   Social Network     Social Network: Not on file  Depression (PHQ2-9): Not on file  Alcohol Screen: Not on file  Housing: Not on file  Utilities: Not on file  Health Literacy: Not on file   Allergies[1] Family History  Problem Relation Age of Onset   Heart disease Father    Cancer Sister     Current Outpatient Medications (Respiratory):    magic mouthwash (nystatin, lidocaine , diphenhydrAMINE , alum & mag hydroxide) suspension*, Swish and spit 5 mLs 4 (four) times daily as needed for mouth pain. (Patient not taking: Reported on 01/14/2021)  Current Outpatient Medications (Analgesics):    naproxen  (NAPROSYN ) 500 MG tablet, Take 1 tablet (500 mg total) by mouth 2 (two) times daily as needed.  Current Outpatient Medications (Other):    amoxicillin -clavulanate (AUGMENTIN ) 875-125 MG tablet, Take 1 tablet by mouth 2 (two) times daily. One po bid x 7 days (Patient not taking: Reported on 01/14/2021)   Ascorbic Acid (VITAMIN C) 1000 MG tablet, Take 1,000 mg by mouth daily.   Calcium-Magnesium-Zinc 167-83-8 MG TABS, Take 1 tablet by mouth daily.   COLLAGEN PO, Take 6,000 mg by mouth daily. (Patient not taking: No sig reported)   cyclobenzaprine  (FLEXERIL ) 10 MG tablet, Take 1 tablet (10 mg total) by mouth at bedtime.  desvenlafaxine (PRISTIQ) 100 MG 24 hr tablet, Take 100 mg by mouth daily. (Patient not taking: No sig reported)   magic mouthwash (nystatin, lidocaine , diphenhydrAMINE , alum & mag hydroxide) suspension*, Swish and spit 5 mLs 4 (four) times daily as needed for mouth pain. (Patient not taking: Reported on 01/14/2021)   methylphenidate 18 MG PO CR tablet, Take 18 mg by mouth daily. (Patient not taking: No sig reported)   MILK THISTLE PO, Take 400 mg by mouth daily. (Patient not taking: No sig reported)   Misc Natural Products (TURMERIC CURCUMIN) CAPS, Take 1 capsule by mouth daily.   Multiple Vitamin (MULTIVITAMIN WITH MINERALS) TABS tablet, Take 1 tablet by mouth daily.   pantoprazole  (PROTONIX ) 20 MG  tablet, TAKE 1 TABLET (20 MG TOTAL) BY MOUTH DAILY. (Patient not taking: No sig reported)   Probiotic Product (PROBIOTIC ADVANCED) CAPS, Take 1 capsule by mouth daily.   vortioxetine HBr (TRINTELLIX) 20 MG TABS tablet, Trintellix 20 mg tablet  TAKE 1 TABLET BY MOUTH EVERY DAY AS DIRECTED  * These medications belong to multiple therapeutic classes and are listed under each applicable group.   Reviewed prior external information including notes and imaging from  primary care provider As well as notes that were available from care everywhere and other healthcare systems.  Past medical history, social, surgical and family history all reviewed in electronic medical record.  No pertanent information unless stated regarding to the chief complaint.   Review of Systems:  No headache, visual changes, nausea, vomiting, diarrhea, constipation, dizziness, abdominal pain, skin rash, fevers, chills, night sweats, weight loss, swollen lymph nodes, body aches, joint swelling, chest pain, shortness of breath, mood changes. POSITIVE muscle aches  Objective  There were no vitals taken for this visit.   General: No apparent distress alert and oriented x3 mood and affect normal, dressed appropriately.  HEENT: Pupils equal, extraocular movements intact  Respiratory: Patient's speak in full sentences and does not appear short of breath  Cardiovascular: No lower extremity edema, non tender, no erythema      Impression and Recommendations:           [1] No Known Allergies

## 2024-03-04 ENCOUNTER — Ambulatory Visit: Admitting: Family Medicine

## 2024-03-05 ENCOUNTER — Encounter: Payer: Self-pay | Admitting: Family Medicine

## 2024-03-05 ENCOUNTER — Ambulatory Visit: Admitting: Family Medicine

## 2024-03-05 VITALS — BP 126/86 | HR 80 | Ht 70.0 in | Wt 141.0 lb

## 2024-03-05 DIAGNOSIS — M357 Hypermobility syndrome: Secondary | ICD-10-CM

## 2024-03-05 DIAGNOSIS — M5030 Other cervical disc degeneration, unspecified cervical region: Secondary | ICD-10-CM

## 2024-03-05 DIAGNOSIS — M542 Cervicalgia: Secondary | ICD-10-CM

## 2024-03-05 MED ORDER — METHYLPREDNISOLONE ACETATE 80 MG/ML IJ SUSP
80.0000 mg | Freq: Once | INTRAMUSCULAR | Status: AC
  Administered 2024-03-05: 80 mg via INTRAMUSCULAR

## 2024-03-05 MED ORDER — KETOROLAC TROMETHAMINE 30 MG/ML IJ SOLN
60.0000 mg | Freq: Once | INTRAMUSCULAR | Status: AC
  Administered 2024-03-05: 60 mg via INTRAMUSCULAR

## 2024-03-05 MED ORDER — DULOXETINE HCL 20 MG PO CPEP: 20.0000 mg | ORAL_CAPSULE | Freq: Every day | ORAL | 0 refills | Status: AC

## 2024-03-05 NOTE — Assessment & Plan Note (Addendum)
 Significant facet arthropathy noted.  Toradol  and Depo-Medrol  injections given.

## 2024-03-05 NOTE — Patient Instructions (Addendum)
 Cymbalta  20mg  [facility] [phone] - Epidural  Cocktail injection. No NSAIDs for 24 hours.  Stop lexapro Labs today

## 2024-03-05 NOTE — Assessment & Plan Note (Signed)
 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  Concern with looking at patient's imaging and there does appear to be potentially even some erosive changes of some of the facet joints.  At this point I would like further workup laboratory to further evaluate for any underlying autoimmune disease.  Patient has had workup previously that was indeterminant.  Patient does have some erythema of the cheeks that does make me somewhat concerned for lupus as a possibility.  Sjogren syndrome is also within the differential.  Will start her though on Cymbalta .  20 mg and discontinue the Lexapro.  Think that this can help her with some of the underlying arthritic changes noted.  Given home exercises.  Held on any type of osteopathic manipulation at this moment.  Follow-up again in 2 to 3 months.

## 2024-03-15 ENCOUNTER — Telehealth: Payer: Self-pay | Admitting: Family Medicine

## 2024-03-15 NOTE — Telephone Encounter (Signed)
 Patient called stating that since she has switched from Lexapro to Cymbalta , she has felt terrible. Increased heart rate, sweaty palms, etc. She asked if this would be coming from stopping Lexapro suddenly?  Please advise.

## 2024-03-18 ENCOUNTER — Encounter: Payer: Self-pay | Admitting: Family Medicine

## 2024-03-19 NOTE — Telephone Encounter (Signed)
 Left message for patient to call back

## 2024-03-20 NOTE — Discharge Instructions (Signed)

## 2024-03-20 NOTE — Telephone Encounter (Signed)
 Spoke with patient. She had her PCP help her titrate off Lexapro. Is now only on Cymbalta  and doing well. Scheduled for epidural tomorrow. Scheduled patient for f/u in  6-8 weeks.

## 2024-03-21 ENCOUNTER — Ambulatory Visit
Admission: RE | Admit: 2024-03-21 | Discharge: 2024-03-21 | Disposition: A | Discharge status: Home / Self Care | Source: Ambulatory Visit | Attending: Family Medicine | Admitting: Family Medicine

## 2024-03-21 DIAGNOSIS — M542 Cervicalgia: Secondary | ICD-10-CM

## 2024-03-21 MED ORDER — IOPAMIDOL (ISOVUE-M 300) INJECTION 61%
1.0000 mL | Freq: Once | INTRAMUSCULAR | Status: AC | PRN
  Administered 2024-03-21: 1 mL via EPIDURAL

## 2024-03-21 MED ORDER — TRIAMCINOLONE ACETONIDE 40 MG/ML IJ SUSP (RADIOLOGY)
60.0000 mg | Freq: Once | INTRAMUSCULAR | Status: AC
  Administered 2024-03-21: 60 mg via EPIDURAL

## 2024-03-22 ENCOUNTER — Telehealth: Payer: Self-pay

## 2024-03-25 NOTE — Progress Notes (Unsigned)
"             ° °   [facility] [address] [place] [address] Phone: [phone]   Assessment and Plan:     ***    Pertinent previous records reviewed include ***   Follow Up: ***     Subjective:   I, Connie Bond, am serving as a neurosurgeon for Doctor Morene Mace  Chief Complaint: knee pain   HPI:   1/13/[birth year] Patient is a 59 year old female with knee pain. Patient states  Relevant Historical Information: ***  Additional pertinent review of systems negative.  Current Medications[1]   Objective:     There were no vitals filed for this visit.    There is no height or weight on file to calculate BMI.    Physical Exam:    ***   Electronically signed by:  Odis Mace [facility] 12:31 PM 03/25/2024    [1]  Current Outpatient Medications:    amoxicillin -clavulanate (AUGMENTIN ) 875-125 MG tablet, Take 1 tablet by mouth 2 (two) times daily. One po bid x 7 days (Patient not taking: Reported on 01/14/2021), Disp: 14 tablet, Rfl: 0   Ascorbic Acid (VITAMIN C) 1000 MG tablet, Take 1,000 mg by mouth daily., Disp: , Rfl:    Calcium-Magnesium-Zinc 167-83-8 MG TABS, Take 1 tablet by mouth daily., Disp: , Rfl:    COLLAGEN PO, Take 6,000 mg by mouth daily. (Patient not taking: No sig reported), Disp: , Rfl:    cyclobenzaprine  (FLEXERIL ) 10 MG tablet, Take 1 tablet (10 mg total) by mouth at bedtime., Disp: 30 tablet, Rfl: 2   desvenlafaxine (PRISTIQ) 100 MG 24 hr tablet, Take 100 mg by mouth daily. (Patient not taking: No sig reported), Disp: , Rfl:    DULoxetine  (CYMBALTA ) 20 MG capsule, Take 1 capsule (20 mg total) by mouth daily., Disp: 30 capsule, Rfl: 0   magic mouthwash (nystatin , lidocaine , diphenhydrAMINE , alum & mag hydroxide) suspension, Swish and spit 5 mLs 4 (four) times daily as needed for mouth pain. (Patient not taking: Reported on 01/14/2021), Disp: 180 mL, Rfl: 0   methylphenidate 18 MG PO CR  tablet, Take 18 mg by mouth daily. (Patient not taking: No sig reported), Disp: , Rfl:    MILK THISTLE PO, Take 400 mg by mouth daily. (Patient not taking: No sig reported), Disp: , Rfl:    Misc Natural Products (TURMERIC CURCUMIN) CAPS, Take 1 capsule by mouth daily., Disp: , Rfl:    Multiple Vitamin (MULTIVITAMIN WITH MINERALS) TABS tablet, Take 1 tablet by mouth daily., Disp: , Rfl:    naproxen  (NAPROSYN ) 500 MG tablet, Take 1 tablet (500 mg total) by mouth 2 (two) times daily as needed., Disp: 30 tablet, Rfl: 0   pantoprazole  (PROTONIX ) 20 MG tablet, TAKE 1 TABLET (20 MG TOTAL) BY MOUTH DAILY. (Patient not taking: No sig reported), Disp: 30 tablet, Rfl: 6   Probiotic Product (PROBIOTIC ADVANCED) CAPS, Take 1 capsule by mouth daily., Disp: , Rfl:    vortioxetine HBr (TRINTELLIX) 20 MG TABS tablet, Trintellix 20 mg tablet  TAKE 1 TABLET BY MOUTH EVERY DAY AS DIRECTED, Disp: , Rfl:   "

## 2024-03-26 ENCOUNTER — Ambulatory Visit: Admitting: Sports Medicine

## 2024-04-09 ENCOUNTER — Telehealth: Payer: Self-pay | Admitting: Family Medicine

## 2024-04-09 NOTE — Telephone Encounter (Signed)
 Patient called and would like to report to you following her epidural and would like to discuss some lab work. Please give patient a call when you can

## 2024-05-02 ENCOUNTER — Ambulatory Visit: Admitting: Family Medicine
